# Patient Record
Sex: Male | Born: 1950 | Race: White | Hispanic: No | Marital: Married | Smoking: Never smoker
Health system: Southern US, Community
[De-identification: ages and names within clinical notes are randomized; demographics above are authoritative.]

## PROBLEM LIST (undated history)

## (undated) DIAGNOSIS — F419 Anxiety disorder, unspecified: Secondary | ICD-10-CM

## (undated) DIAGNOSIS — I471 Supraventricular tachycardia, unspecified: Secondary | ICD-10-CM

## (undated) DIAGNOSIS — K219 Gastro-esophageal reflux disease without esophagitis: Secondary | ICD-10-CM

## (undated) DIAGNOSIS — E119 Type 2 diabetes mellitus without complications: Secondary | ICD-10-CM

## (undated) DIAGNOSIS — R7303 Prediabetes: Secondary | ICD-10-CM

## (undated) DIAGNOSIS — F32A Depression, unspecified: Secondary | ICD-10-CM

## (undated) DIAGNOSIS — E785 Hyperlipidemia, unspecified: Secondary | ICD-10-CM

## (undated) DIAGNOSIS — F329 Major depressive disorder, single episode, unspecified: Secondary | ICD-10-CM

## (undated) DIAGNOSIS — I10 Essential (primary) hypertension: Secondary | ICD-10-CM

## (undated) DIAGNOSIS — T7840XA Allergy, unspecified, initial encounter: Secondary | ICD-10-CM

## (undated) DIAGNOSIS — H269 Unspecified cataract: Secondary | ICD-10-CM

## (undated) HISTORY — PX: APPENDECTOMY: SHX54

## (undated) HISTORY — PX: POLYPECTOMY: SHX149

## (undated) HISTORY — DX: Major depressive disorder, single episode, unspecified: F32.9

## (undated) HISTORY — DX: Allergy, unspecified, initial encounter: T78.40XA

## (undated) HISTORY — PX: VASECTOMY: SHX75

## (undated) HISTORY — DX: Depression, unspecified: F32.A

## (undated) HISTORY — DX: Unspecified cataract: H26.9

## (undated) HISTORY — PX: EYE SURGERY: SHX253

## (undated) HISTORY — DX: Anxiety disorder, unspecified: F41.9

## (undated) HISTORY — PX: COLONOSCOPY: SHX174

## (undated) HISTORY — DX: Hyperlipidemia, unspecified: E78.5

---

## 2002-08-07 ENCOUNTER — Ambulatory Visit (HOSPITAL_COMMUNITY): Admission: RE | Admit: 2002-08-07 | Discharge: 2002-08-07 | Payer: Self-pay | Admitting: Internal Medicine

## 2002-08-07 ENCOUNTER — Encounter: Payer: Self-pay | Admitting: Internal Medicine

## 2003-10-11 ENCOUNTER — Ambulatory Visit (HOSPITAL_COMMUNITY): Admission: RE | Admit: 2003-10-11 | Discharge: 2003-10-11 | Payer: Self-pay | Admitting: Internal Medicine

## 2004-12-22 ENCOUNTER — Ambulatory Visit: Payer: Self-pay | Admitting: Orthopedic Surgery

## 2007-06-16 ENCOUNTER — Ambulatory Visit: Payer: Self-pay | Admitting: Internal Medicine

## 2007-06-30 ENCOUNTER — Ambulatory Visit: Payer: Self-pay | Admitting: Internal Medicine

## 2008-05-03 ENCOUNTER — Encounter (HOSPITAL_COMMUNITY): Admission: RE | Admit: 2008-05-03 | Discharge: 2008-05-28 | Payer: Self-pay | Admitting: Internal Medicine

## 2008-07-06 ENCOUNTER — Emergency Department (HOSPITAL_COMMUNITY): Admission: EM | Admit: 2008-07-06 | Discharge: 2008-07-06 | Payer: Self-pay | Admitting: Emergency Medicine

## 2008-08-05 ENCOUNTER — Emergency Department (HOSPITAL_COMMUNITY): Admission: EM | Admit: 2008-08-05 | Discharge: 2008-08-05 | Payer: Self-pay | Admitting: Emergency Medicine

## 2011-01-13 NOTE — Procedures (Signed)
Edward Armstrong, Edward Armstrong                 ACCOUNT NO.:  [id number]   MEDICAL RECORD NO.:  [id number]         PATIENT TYPE:  PREC   LOCATION:  RAD                           FACILITY:  [facility]   PHYSICIAN:  Kingsley Callander. Ouida Sills, MD       DATE OF BIRTH:  [birth year]   DATE OF PROCEDURE:  05/03/2008  DATE OF DISCHARGE:                                  STRESS TEST   The patient underwent a Myoview stress test for evaluation of recent  symptoms of chest pain.  He exercised 10 minutes and 32 seconds (1  minute and 32 seconds in the stage IV of the Bruce protocol) attaining a  maximal heart rate of 178 (109% of the age-predicted maximal heart rate)  at a workload of 12.8 METS and discontinued exercise due to fatigue.  There were no symptoms of chest pain.  There were no arrhythmias.  A 1  mm inferolateral ST-segment depression was noted during the exercise.  His baseline EKG revealed normal sinus rhythm at 79 beats per minute.   IMPRESSION:  Abnormal exercise stress test with inferolateral ST-segment  changes during peak exercise.  Myoview image is pending.      Kingsley Callander. Ouida Sills, MD  Electronically Signed     ROF/MEDQ  D:  05/03/2008  T:  05/03/2008  Job:  [id number]

## 2011-01-16 NOTE — Procedures (Signed)
NAME:  Edward Armstrong, Edward Armstrong                           ACCOUNT NO.:  [id number]   MEDICAL RECORD NO.:  [id number]                   PATIENT TYPE:  OUT   LOCATION:  [facility]                                 FACILITY:  [facility]   PHYSICIAN:  Kingsley Callander. Ouida Sills, M.D.                  DATE OF BIRTH:  14-Feb-1951   DATE OF PROCEDURE:  10/11/2003  DATE OF DISCHARGE:                                    STRESS TEST   DESCRIPTION:  [name] exercised 10 minutes (1 minute in stage 4 of the Bruce  protocol) attaining a maximal heart rate of 189 (112% of the age-predicted  maximal heart rate) at a work load of 12.8 METs.  There were no symptoms of  angina.  There were no arrhythmias.  There were no ST segment changes  diagnostic of ischemia.  There was a T wave change noted in lead III only at  peak exercise.  The baseline EKG revealed normal sinus rhythm at 77 beats  per minute.   IMPRESSION:  No evidence of exercise-induced ischemia.      ___________________________________________                                            Kingsley Callander. Ouida Sills, M.D.   ROF/MEDQ  D:  10/11/2003  T:  10/11/2003  Job:  [id number]

## 2013-02-21 ENCOUNTER — Ambulatory Visit (INDEPENDENT_AMBULATORY_CARE_PROVIDER_SITE_OTHER): Payer: BC Managed Care – PPO

## 2013-02-21 ENCOUNTER — Ambulatory Visit (INDEPENDENT_AMBULATORY_CARE_PROVIDER_SITE_OTHER): Payer: BC Managed Care – PPO | Admitting: Orthopedic Surgery

## 2013-02-21 VITALS — BP 148/92 | Ht 71.0 in | Wt 231.0 lb

## 2013-02-21 DIAGNOSIS — M25519 Pain in unspecified shoulder: Secondary | ICD-10-CM

## 2013-02-21 DIAGNOSIS — M25512 Pain in left shoulder: Secondary | ICD-10-CM | POA: Insufficient documentation

## 2013-02-21 DIAGNOSIS — M75102 Unspecified rotator cuff tear or rupture of left shoulder, not specified as traumatic: Secondary | ICD-10-CM | POA: Insufficient documentation

## 2013-02-21 DIAGNOSIS — M67919 Unspecified disorder of synovium and tendon, unspecified shoulder: Secondary | ICD-10-CM

## 2013-02-21 NOTE — Patient Instructions (Addendum)
You have received a steroid shot. 15% of patients experience increased pain at the injection site with in the next 24 hours. This is best treated with ice and tylenol extra strength 2 tabs every 8 hours. If you are still having pain please call the office.   Apply MAXFREEZE  Twice a day (pick up over the counter)  Start exercises for shoulder

## 2013-02-22 ENCOUNTER — Encounter: Payer: Self-pay | Admitting: Orthopedic Surgery

## 2013-02-22 NOTE — Progress Notes (Signed)
Patient ID: Edward Twilley., male   DOB: 03-14-1951, 62 y.o.   MRN: [id number] Chief Complaint  Patient presents with  . Shoulder Pain    Left shoulder pain    62-year-old male with a history of left shoulder pain for the last 2 months. No trauma. Symptoms came on gradually. He has a history of hypertension and high cholesterol with a high hemoglobin A1c. He status post appendectomy and vasectomy. He is referred to [place] by Dr. Adriana Mccallum  Medications include Nexium 40, Zetia 10, enalapril 10, Lopressor 1.25, aspirin 81 mg, T12 thousand milligrams, multivitamin. Family history of heart disease and diabetes. Social history married, occupation Public house manager. Social habits no smoking social alcohol use caffeine daily no street drugs and a bachelor of science degree in business was obtained  History of snoring and heartburn joint pain anxiety and seasonal allergies under review of systems other systems not mentioned were reviewed and were negative  His shoulder pain is intermittent it occurs when he is reaching behind him moving his arm away from his body or holding something in his hand with his arm in full extension away from the body.  BP 148/92  Ht 5\' 11"  (1.803 m)  Wt 231 lb (104.781 kg)  BMI 32.23 kg/m2 General appearance is normal, the patient is alert and oriented x3 with normal mood and affect. Ambulation remains normal without assistive device or noticeable limping  He has full range of motion of his right shoulder with no tenderness pain or instability strength is normal scans intact good pulse and normal sensation  His left shoulder has tenderness in the rotator interval painful for elevation and internal rotation. Apprehension tests are normal strength tests show weakness in the supraspinatus at the ankle he can sign. Skin normal pulse intact sensation normal.  X-rays show no major abnormalities no fractures no bone abnormality  Impression rotator cuff syndrome recommend injection  exercises medication as needed and followup for reevaluation in several weeks  Subacromial Shoulder Injection Procedure Note  Pre-operative Diagnosis: left RC Syndrome  Post-operative Diagnosis: same  Indications: pain   Anesthesia: ethyl chloride   Procedure Details   Verbal consent was obtained for the procedure. The shoulder was prepped withalcohol and the skin was anesthetized. A 20 gauge needle was advanced into the subacromial space through posterior approach without difficulty  The space was then injected with 3 ml 1% lidocaine and 1 ml of depomedrol. The injection site was cleansed with isopropyl alcohol and a dressing was applied.  Complications:  None; patient tolerated the procedure well.

## 2014-04-17 ENCOUNTER — Other Ambulatory Visit (HOSPITAL_COMMUNITY): Payer: Self-pay | Admitting: Internal Medicine

## 2014-04-17 ENCOUNTER — Ambulatory Visit (HOSPITAL_COMMUNITY)
Admission: RE | Admit: 2014-04-17 | Discharge: 2014-04-17 | Disposition: A | Discharge status: Home / Self Care | Payer: BC Managed Care – PPO | Source: Ambulatory Visit | Attending: Internal Medicine | Admitting: Internal Medicine

## 2014-04-17 DIAGNOSIS — R059 Cough, unspecified: Secondary | ICD-10-CM | POA: Diagnosis not present

## 2014-04-17 DIAGNOSIS — R509 Fever, unspecified: Secondary | ICD-10-CM

## 2014-04-17 DIAGNOSIS — R05 Cough: Secondary | ICD-10-CM

## 2014-04-17 DIAGNOSIS — R0789 Other chest pain: Secondary | ICD-10-CM | POA: Insufficient documentation

## 2014-05-09 ENCOUNTER — Other Ambulatory Visit (HOSPITAL_COMMUNITY): Payer: Self-pay | Admitting: Internal Medicine

## 2014-05-09 ENCOUNTER — Ambulatory Visit (HOSPITAL_COMMUNITY)
Admission: RE | Admit: 2014-05-09 | Discharge: 2014-05-09 | Disposition: A | Discharge status: Home / Self Care | Payer: BC Managed Care – PPO | Source: Ambulatory Visit | Attending: Internal Medicine | Admitting: Internal Medicine

## 2014-05-09 DIAGNOSIS — J189 Pneumonia, unspecified organism: Secondary | ICD-10-CM

## 2015-10-05 ENCOUNTER — Emergency Department (INDEPENDENT_AMBULATORY_CARE_PROVIDER_SITE_OTHER)
Admission: EM | Admit: 2015-10-05 | Discharge: 2015-10-05 | Disposition: A | Discharge status: Home / Self Care | Payer: Managed Care, Other (non HMO) | Source: Home / Self Care | Attending: Family Medicine | Admitting: Family Medicine

## 2015-10-05 ENCOUNTER — Encounter (HOSPITAL_COMMUNITY): Payer: Self-pay | Admitting: Emergency Medicine

## 2015-10-05 DIAGNOSIS — H6121 Impacted cerumen, right ear: Secondary | ICD-10-CM | POA: Diagnosis not present

## 2015-10-05 HISTORY — DX: Essential (primary) hypertension: I10

## 2015-10-05 HISTORY — DX: Prediabetes: R73.03

## 2015-10-05 NOTE — ED Notes (Signed)
Here with right ear wax impaction that started 2 days ago Denies dizziness, slight pain after using otc ear drops

## 2015-10-05 NOTE — Discharge Instructions (Signed)
Cerumen Impaction The structures of the external ear canal secrete a waxy substance known as cerumen. Excess cerumen can build up in the ear canal, causing a condition known as cerumen impaction. Cerumen impaction can cause ear pain and disrupt the function of the ear. The rate of cerumen production differs for each individual. In certain individuals, the configuration of the ear canal may decrease his or her ability to naturally remove cerumen. CAUSES Cerumen impaction is caused by excessive cerumen production or buildup. RISK FACTORS  Frequent use of swabs to clean ears.  Having narrow ear canals.  Having eczema.  Being dehydrated. SIGNS AND SYMPTOMS  Diminished hearing.  Ear drainage.  Ear pain.  Ear itch. TREATMENT Treatment may involve:  Over-the-counter or prescription ear drops to soften the cerumen.  Removal of cerumen by a health care provider. This may be done with:  Irrigation with warm water. This is the most common method of removal.  Ear curettes and other instruments.  Surgery. This may be done in severe cases. HOME CARE INSTRUCTIONS  Take medicines only as directed by your health care provider.  Do not insert objects into the ear with the intent of cleaning the ear. PREVENTION  Do not insert objects into the ear, even with the intent of cleaning the ear. Removing cerumen as a part of normal hygiene is not necessary, and the use of swabs in the ear canal is not recommended.  Drink enough water to keep your urine clear or pale yellow.  Control your eczema if you have it. SEEK MEDICAL CARE IF:  You develop ear pain.  You develop bleeding from the ear.  The cerumen does not clear after you use ear drops as directed.   This information is not intended to replace advice given to you by your health care provider. Make sure you discuss any questions you have with your health care provider.   Document Released: 09/24/2004 Document Revised: 09/07/2014  Document Reviewed: 04/03/2015 Elsevier Interactive Patient Education 2016 [employer]  

## 2015-10-05 NOTE — ED Provider Notes (Signed)
CSN: [id number]     Arrival date & time 10/05/15  1429 History   First MD Initiated Contact with Patient 10/05/15 1548     Chief Complaint  Patient presents with  . Cerumen Impaction   (Consider location/radiation/quality/duration/timing/severity/associated sxs/prior Treatment) The history is provided by the patient. No language interpreter was used.  Right ear stopped up. He has been having reduced hearing for about 2-3 days. He tried OTC products with no improvement. He denies any pain, no discharge. Denies previous episode.  Past Medical History  Diagnosis Date  . Hypertension   . Pre-diabetes    Past Surgical History  Procedure Laterality Date  . Appendectomy     No family history on file. Social History  Substance Use Topics  . Smoking status: Never Smoker   . Smokeless tobacco: None  . Alcohol Use: Yes    Review of Systems  HENT: Negative for ear discharge and ear pain.        Right ear stopped up  Respiratory: Negative.   Cardiovascular: Negative.   All other systems reviewed and are negative.  Filed Vitals:   10/05/15 1517  BP: 157/94  Pulse: 102  Temp: 98.1 F (36.7 C)  TempSrc: Oral  SpO2: 96%    Allergies  Macrodantin and Septra  Home Medications   Prior to Admission medications   Medication Sig Start Date End Date Taking? Authorizing Provider  ALPRAZolam Duanne Moron) 0.5 MG tablet Take 0.5 mg by mouth at bedtime as needed for anxiety.   Yes Historical Provider, MD  esomeprazole (NEXIUM) 20 MG capsule Take 20 mg by mouth daily at 12 noon.   Yes Historical Provider, MD  esomeprazole (NEXIUM) 40 MG capsule Take 40 mg by mouth daily at 12 noon.   Yes Historical Provider, MD  ezetimibe (ZETIA) 10 MG tablet Take 10 mg by mouth daily.   Yes Historical Provider, MD   Meds Ordered and Administered this Visit  Medications - No data to display  BP 157/94 mmHg  Pulse 102  Temp(Src) 98.1 F (36.7 C) (Oral)  SpO2 96% No data found.   Physical Exam    Constitutional: He appears well-developed. No distress.  HENT:  Head: Normocephalic.  Left Ear: Tympanic membrane and ear canal normal.  Ears:  Cardiovascular: Normal rate, regular rhythm and normal heart sounds.   No murmur heard. Pulmonary/Chest: Effort normal and breath sounds normal. No respiratory distress. He has no wheezes.  Nursing note and vitals reviewed.   ED Course  Procedures (including critical care time)  Labs Review Labs Reviewed - No data to display  Imaging Review No results found.   Visual Acuity Review  Right Eye Distance:   Left Eye Distance:   Bilateral Distance:    Right Eye Near:   Left Eye Near:    Bilateral Near:         MDM  No diagnosis found. Cerumen impaction, right  Symptomatic cerumen impaction of the right ear. Patient consented to ear irrigation. Procedure completed by nursing without complication. Symptoms improved . Return precaution discussed. Follow up as needed.    Kinnie Feil, MD 10/05/15 [id number]

## 2015-10-28 ENCOUNTER — Encounter (HOSPITAL_COMMUNITY): Payer: Self-pay | Admitting: Emergency Medicine

## 2015-10-28 ENCOUNTER — Emergency Department (INDEPENDENT_AMBULATORY_CARE_PROVIDER_SITE_OTHER)
Admission: EM | Admit: 2015-10-28 | Discharge: 2015-10-28 | Disposition: A | Discharge status: Home / Self Care | Payer: Managed Care, Other (non HMO) | Source: Home / Self Care | Attending: Family Medicine | Admitting: Family Medicine

## 2015-10-28 DIAGNOSIS — J111 Influenza due to unidentified influenza virus with other respiratory manifestations: Secondary | ICD-10-CM

## 2015-10-28 HISTORY — DX: Gastro-esophageal reflux disease without esophagitis: K21.9

## 2015-10-28 HISTORY — DX: Type 2 diabetes mellitus without complications: E11.9

## 2015-10-28 MED ORDER — OSELTAMIVIR PHOSPHATE 75 MG PO CAPS: 75.0000 mg | ORAL_CAPSULE | Freq: Two times a day (BID) | ORAL | Status: DC

## 2015-10-28 NOTE — Discharge Instructions (Signed)

## 2015-10-28 NOTE — ED Provider Notes (Signed)
CSN: [id number]     Arrival date & time 10/28/15  1843 History   First MD Initiated Contact with Patient 10/28/15 1957     Chief Complaint  Patient presents with  . Cough  . Generalized Body Aches   (Consider location/radiation/quality/duration/timing/severity/associated sxs/prior Treatment) HPI Pt presents with sore throat, body aches, fever, chills for 1 days Home treatment has been OTC meds without much relief of symptoms Fever is improved for short periods of time with OTC antipyretics. Pain score is 4 mostly from coughing and body aches Taking fluids, no appetite No flu shot (son had bad reaction to vaccine.  Has been exposed to others with similar symptoms.  Denies: CP, SOB, vomiting or diarrhea.  Past Medical History  Diagnosis Date  . Hypertension   . Pre-diabetes   . Diabetes mellitus without complication ([place])   . GERD (gastroesophageal reflux disease)    Past Surgical History  Procedure Laterality Date  . Appendectomy     Family History  Problem Relation Age of Onset  . Diabetes Mother   . Heart attack Father    Social History  Substance Use Topics  . Smoking status: Never Smoker   . Smokeless tobacco: None  . Alcohol Use: Yes    Review of Systems See history of present illness Allergies  Macrodantin and Septra  Home Medications   Prior to Admission medications   Medication Sig Start Date End Date Taking? Authorizing Provider  ALPRAZolam Duanne Moron) 0.5 MG tablet Take 0.5 mg by mouth at bedtime as needed for anxiety.   Yes Historical Provider, MD  enalapril (VASOTEC) 20 MG tablet Take 20 mg by mouth daily.   Yes Historical Provider, MD  esomeprazole (NEXIUM) 40 MG capsule Take 40 mg by mouth daily at 12 noon.   Yes Historical Provider, MD  ezetimibe (ZETIA) 10 MG tablet Take 10 mg by mouth daily.   Yes Historical Provider, MD  metFORMIN (GLUCOPHAGE) 500 MG tablet Take by mouth daily.   Yes Historical Provider, MD  esomeprazole (NEXIUM) 20 MG capsule Take  20 mg by mouth daily at 12 noon.    Historical Provider, MD  oseltamivir (TAMIFLU) 75 MG capsule Take 1 capsule (75 mg total) by mouth every 12 (twelve) hours. 10/28/15   Konrad Felix, PA  oseltamivir (TAMIFLU) 75 MG capsule Take 1 capsule (75 mg total) by mouth every 12 (twelve) hours. 10/28/15   Konrad Felix, PA   Meds Ordered and Administered this Visit  Medications - No data to display  BP 144/76 mmHg  Pulse 92  Temp(Src) 99.1 F (37.3 C) (Oral)  Resp 18  SpO2 98% No data found.   Physical Exam NURSES NOTES AND VITAL SIGNS REVIEWED. CONSTITUTIONAL: Well developed, well nourished, no acute distress HEENT: normocephalic, atraumatic, right and left TM's are normal EYES: Conjunctiva normal NECK:normal ROM, supple, no adenopathy PULMONARY:No respiratory distress, normal effort, Lungs: CTAb/l, no wheezes, or increased work of breathing CARDIOVASCULAR: RRR, no murmur ABDOMEN: soft, ND, NT, +'ve BS MUSCULOSKELETAL: Normal ROM of all extremities,  SKIN: warm and dry without rash PSYCHIATRIC: Mood and affect, behavior are normal  ED Course  Procedures (including critical care time)  Labs Review Labs Reviewed - No data to display  Imaging Review No results found.   Visual Acuity Review  Right Eye Distance:   Left Eye Distance:   Bilateral Distance:    Right Eye Near:   Left Eye Near:    Bilateral Near:  MDM   1. Flu    patient is requesting Tamiflu. Symptomatic treatment at home including ibuprofen for body aches and fever. He is also advised to use lots of fluids.    Konrad Felix, PA 10/28/15 2047

## 2015-10-28 NOTE — ED Notes (Signed)
Pt started with a cough yesterday.  Today he started with body aches, a mild headache, and low grade temp of 99.5. Pt took Advil at 1730 today.

## 2016-07-07 ENCOUNTER — Encounter (INDEPENDENT_AMBULATORY_CARE_PROVIDER_SITE_OTHER): Payer: Self-pay | Admitting: Ophthalmology

## 2016-07-17 ENCOUNTER — Encounter (INDEPENDENT_AMBULATORY_CARE_PROVIDER_SITE_OTHER): Payer: Managed Care, Other (non HMO) | Admitting: Ophthalmology

## 2016-07-17 DIAGNOSIS — H2513 Age-related nuclear cataract, bilateral: Secondary | ICD-10-CM | POA: Diagnosis not present

## 2016-07-17 DIAGNOSIS — I10 Essential (primary) hypertension: Secondary | ICD-10-CM

## 2016-07-17 DIAGNOSIS — D3132 Benign neoplasm of left choroid: Secondary | ICD-10-CM

## 2016-07-17 DIAGNOSIS — H33301 Unspecified retinal break, right eye: Secondary | ICD-10-CM

## 2016-07-17 DIAGNOSIS — H43813 Vitreous degeneration, bilateral: Secondary | ICD-10-CM | POA: Diagnosis not present

## 2016-07-17 DIAGNOSIS — H35033 Hypertensive retinopathy, bilateral: Secondary | ICD-10-CM | POA: Diagnosis not present

## 2016-08-03 ENCOUNTER — Ambulatory Visit (INDEPENDENT_AMBULATORY_CARE_PROVIDER_SITE_OTHER): Payer: Managed Care, Other (non HMO) | Admitting: Ophthalmology

## 2016-08-03 DIAGNOSIS — H33301 Unspecified retinal break, right eye: Secondary | ICD-10-CM

## 2016-12-08 ENCOUNTER — Ambulatory Visit (INDEPENDENT_AMBULATORY_CARE_PROVIDER_SITE_OTHER): Payer: Managed Care, Other (non HMO) | Admitting: Ophthalmology

## 2016-12-18 ENCOUNTER — Ambulatory Visit (INDEPENDENT_AMBULATORY_CARE_PROVIDER_SITE_OTHER): Payer: Managed Care, Other (non HMO) | Admitting: Ophthalmology

## 2016-12-18 DIAGNOSIS — H2513 Age-related nuclear cataract, bilateral: Secondary | ICD-10-CM | POA: Diagnosis not present

## 2016-12-18 DIAGNOSIS — D3132 Benign neoplasm of left choroid: Secondary | ICD-10-CM

## 2016-12-18 DIAGNOSIS — H35033 Hypertensive retinopathy, bilateral: Secondary | ICD-10-CM | POA: Diagnosis not present

## 2016-12-18 DIAGNOSIS — I10 Essential (primary) hypertension: Secondary | ICD-10-CM | POA: Diagnosis not present

## 2016-12-18 DIAGNOSIS — H33301 Unspecified retinal break, right eye: Secondary | ICD-10-CM | POA: Diagnosis not present

## 2016-12-18 DIAGNOSIS — H43813 Vitreous degeneration, bilateral: Secondary | ICD-10-CM

## 2017-04-28 ENCOUNTER — Encounter: Payer: Self-pay | Admitting: *Deleted

## 2017-07-05 ENCOUNTER — Encounter: Payer: Self-pay | Admitting: Gastroenterology

## 2017-08-04 DIAGNOSIS — D229 Melanocytic nevi, unspecified: Secondary | ICD-10-CM | POA: Diagnosis not present

## 2017-08-27 ENCOUNTER — Encounter (HOSPITAL_COMMUNITY): Payer: Self-pay | Admitting: Emergency Medicine

## 2017-08-27 ENCOUNTER — Ambulatory Visit (HOSPITAL_COMMUNITY)
Admission: EM | Admit: 2017-08-27 | Discharge: 2017-08-27 | Disposition: A | Discharge status: Home / Self Care | Payer: Managed Care, Other (non HMO) | Attending: Internal Medicine | Admitting: Internal Medicine

## 2017-08-27 DIAGNOSIS — H9201 Otalgia, right ear: Secondary | ICD-10-CM

## 2017-08-27 MED ORDER — NEOMYCIN-POLYMYXIN-HC 3.5-10000-1 OT SUSP: 4.0000 [drp] | Freq: Four times a day (QID) | OTIC | 0 refills | Status: AC

## 2017-08-27 NOTE — ED Provider Notes (Signed)
[facility]    CSN: [id number] Arrival date & time: 08/27/17  1806     History   Chief Complaint Chief Complaint  Patient presents with  . Ear Fullness    HPI Edward Armstrong. is a 66 y.o. male.   66 year old male comes in for 1 day history of right ear pain.  States has decreased hearing and feeling of ear fullness for a while, he used his finger to scratch his ear today, and started having ear pain.  He states he has to wear ear plugs during work.  No frequent cotton swab use.  Denies  URI symptoms such as cough, congestion, sore throat.  Denies fever, chills, night sweats.  Has not tried anything for the symptoms.      Past Medical History:  Diagnosis Date  . Diabetes mellitus without complication ([place])   . GERD (gastroesophageal reflux disease)   . Hypertension   . Pre-diabetes     Patient Active Problem List   Diagnosis Date Noted  . Left shoulder pain 02/21/2013  . Rotator cuff syndrome of left shoulder 02/21/2013    Past Surgical History:  Procedure Laterality Date  . APPENDECTOMY         Home Medications    Prior to Admission medications   Medication Sig Start Date End Date Taking? Authorizing Provider  ALPRAZolam Duanne Moron) 0.5 MG tablet Take 0.5 mg by mouth at bedtime as needed for anxiety.   Yes [provider]  enalapril (VASOTEC) 20 MG tablet Take 20 mg by mouth daily.   Yes [provider]  esomeprazole (NEXIUM) 20 MG capsule Take 20 mg by mouth daily at 12 noon.   Yes [provider]  ezetimibe (ZETIA) 10 MG tablet Take 10 mg by mouth daily.   Yes [provider]  metFORMIN (GLUCOPHAGE) 500 MG tablet Take by mouth daily.   Yes [provider]  esomeprazole (NEXIUM) 40 MG capsule Take 40 mg by mouth daily at 12 noon.    [provider]  neomycin-polymyxin-hydrocortisone (CORTISPORIN) 3.5-10000-1 OTIC suspension Place 4 drops into the right ear 4 (four) times daily for 7 days.  08/27/17 09/03/17  Ok Edwards, PA-C    Family History Family History  Problem Relation Age of Onset  . Diabetes Mother   . Heart attack Father     Social History Social History   Tobacco Use  . Smoking status: Never Smoker  Substance Use Topics  . Alcohol use: Yes  . Drug use: No     Allergies   Macrodantin [nitrofurantoin macrocrystal]; Clindamycin/lincomycin; and Septra [sulfamethoxazole-trimethoprim]   Review of Systems Review of Systems  Reason unable to perform ROS: See HPI as above.     Physical Exam Triage Vital Signs ED Triage Vitals  Enc Vitals Group     BP 08/27/17 1827 (!) 185/72     Pulse Rate 08/27/17 1827 65     Resp 08/27/17 1827 16     Temp 08/27/17 1827 98.5 F (36.9 C)     Temp Source 08/27/17 1827 Oral     SpO2 08/27/17 1827 100 %     Weight 08/27/17 1826 200 lb (90.7 kg)     Height 08/27/17 1826 5\' 11"  (1.803 m)     Head Circumference --      Peak Flow --      Pain Score 08/27/17 1826 0     Pain Loc --      Pain Edu? --  Excl. in GC? --    No data found.  Updated Vital Signs BP (!) 185/72   Pulse 65   Temp 98.5 F (36.9 C) (Oral)   Resp 16   Ht 5\' 11"  (1.803 m)   Wt 200 lb (90.7 kg)   SpO2 100%   BMI 27.89 kg/m    Physical Exam  Constitutional: He is oriented to person, place, and time. He appears well-developed and well-nourished. No distress.  HENT:  Head: Normocephalic and atraumatic.  No tenderness on palpation of the tragus.  Cerumen impaction of the right ear.  TM not visible.  Post irrigation: Noticed film around the outer ear canal, removed with hemostat, foreign body consistent with plastic.  Ear canal with slight erythema.  TM pearly gray without erythema, bulging.  Eyes: Conjunctivae are normal. Pupils are equal, round, and reactive to light.  Neurological: He is alert and oriented to person, place, and time.     UC Treatments / Results  Labs (all labs ordered are listed, but only abnormal results are  displayed) Labs Reviewed - No data to display  EKG  EKG Interpretation None       Radiology No results found.  Procedures Procedures (including critical care time)  Medications Ordered in UC Medications - No data to display   Initial Impression / Assessment and Plan / UC Course  I have reviewed the triage vital signs and the nursing notes.  Pertinent labs & imaging results that were available during my care of the patient were reviewed by me and considered in my medical decision making (see chart for details).    Patient with improved symptoms after ear irrigation.  Discussed ear canal erythema could be due to irrigation, however, given patient's job requires repeat use of earplugs, will cover for otitis externa.  Cortisporin as directed.  Return precautions given.  Patient expresses understanding and agrees to plan.  Final Clinical Impressions(s) / UC Diagnoses   Final diagnoses:  Otalgia of right ear    ED Discharge Orders        Ordered    neomycin-polymyxin-hydrocortisone (CORTISPORIN) 3.5-10000-1 OTIC suspension  4 times daily     08/27/17 1853       Ok Edwards, PA-C 08/27/17 1900

## 2017-08-27 NOTE — ED Triage Notes (Signed)
PT reports right ear fullness that started today.

## 2017-08-27 NOTE — Discharge Instructions (Signed)
Right ear pain could be due to earwax buildup, irritation due to repetitive use of earplugs.  Right ear canal is slightly red, this could be due to the ear irrigation, or outer ear infection.  Start Cortisporin for possible outer ear infection.  He can try hydrogen peroxide periodically in the future to help soften earwax.  Follow-up with PCP for reevaluation if symptoms worsen/persist.

## 2017-12-01 ENCOUNTER — Encounter: Payer: Self-pay | Admitting: Gastroenterology

## 2018-01-21 ENCOUNTER — Ambulatory Visit (AMBULATORY_SURGERY_CENTER): Payer: Self-pay

## 2018-01-21 ENCOUNTER — Other Ambulatory Visit: Payer: Self-pay

## 2018-01-21 VITALS — Ht 71.0 in | Wt 209.4 lb

## 2018-01-21 DIAGNOSIS — Z1211 Encounter for screening for malignant neoplasm of colon: Secondary | ICD-10-CM

## 2018-01-21 MED ORDER — NA SULFATE-K SULFATE-MG SULF 17.5-3.13-1.6 GM/177ML PO SOLN: 1.0000 | Freq: Once | ORAL | 0 refills | Status: AC

## 2018-01-21 NOTE — Progress Notes (Signed)
Denies allergies to eggs or soy products. Denies complication of anesthesia or sedation. Denies use of weight loss medication. Denies use of O2.   Emmi instructions declined.  

## 2018-01-31 ENCOUNTER — Encounter: Payer: Self-pay | Admitting: Gastroenterology

## 2018-02-04 ENCOUNTER — Other Ambulatory Visit: Payer: Self-pay

## 2018-02-04 ENCOUNTER — Encounter: Payer: Self-pay | Admitting: Gastroenterology

## 2018-02-04 ENCOUNTER — Ambulatory Visit (AMBULATORY_SURGERY_CENTER): Payer: Managed Care, Other (non HMO) | Admitting: Gastroenterology

## 2018-02-04 VITALS — BP 124/74 | HR 60 | Temp 97.1°F | Resp 10 | Ht 71.0 in | Wt 209.0 lb

## 2018-02-04 DIAGNOSIS — D122 Benign neoplasm of ascending colon: Secondary | ICD-10-CM

## 2018-02-04 DIAGNOSIS — K635 Polyp of colon: Secondary | ICD-10-CM | POA: Diagnosis not present

## 2018-02-04 DIAGNOSIS — D123 Benign neoplasm of transverse colon: Secondary | ICD-10-CM

## 2018-02-04 DIAGNOSIS — D120 Benign neoplasm of cecum: Secondary | ICD-10-CM | POA: Diagnosis not present

## 2018-02-04 DIAGNOSIS — Z1211 Encounter for screening for malignant neoplasm of colon: Secondary | ICD-10-CM

## 2018-02-04 DIAGNOSIS — D125 Benign neoplasm of sigmoid colon: Secondary | ICD-10-CM

## 2018-02-04 MED ORDER — SODIUM CHLORIDE 0.9 % IV SOLN: 500.0000 mL | Freq: Once | INTRAVENOUS | Status: DC

## 2018-02-04 NOTE — Progress Notes (Signed)
A/ox3 pleased with MAC, report to RN 

## 2018-02-04 NOTE — Patient Instructions (Signed)
HANDOUTS GIVEN FOR POLYPS, DIVERTICULOSIS AND HEMORRHOIDS  YOU HAD AN ENDOSCOPIC PROCEDURE TODAY AT THE [facility]:   Refer to the procedure report that was given to you for any specific questions about what was found during the examination.  If the procedure report does not answer your questions, please call your gastroenterologist to clarify.  If you requested that your care partner not be given the details of your procedure findings, then the procedure report has been included in a sealed envelope for you to review at your convenience later.  YOU SHOULD EXPECT: Some feelings of bloating in the abdomen. Passage of more gas than usual.  Walking can help get rid of the air that was put into your GI tract during the procedure and reduce the bloating. If you had a lower endoscopy (such as a colonoscopy or flexible sigmoidoscopy) you may notice spotting of blood in your stool or on the toilet paper. If you underwent a bowel prep for your procedure, you may not have a normal bowel movement for a few days.  Please Note:  You might notice some irritation and congestion in your nose or some drainage.  This is from the oxygen used during your procedure.  There is no need for concern and it should clear up in a day or so.  SYMPTOMS TO REPORT IMMEDIATELY:   Following lower endoscopy (colonoscopy or flexible sigmoidoscopy):  Excessive amounts of blood in the stool  Significant tenderness or worsening of abdominal pains  Swelling of the abdomen that is new, acute  Fever of 100F or higher  For urgent or emergent issues, a gastroenterologist can be reached at any hour by calling [phone].   DIET:  We do recommend a small meal at first, but then you may proceed to your regular diet.  Drink plenty of fluids but you should avoid alcoholic beverages for 24 hours.  ACTIVITY:  You should plan to take it easy for the rest of today and you should NOT DRIVE or use heavy machinery until tomorrow  (because of the sedation medicines used during the test).    FOLLOW UP: Our staff will call the number listed on your records the next business day following your procedure to check on you and address any questions or concerns that you may have regarding the information given to you following your procedure. If we do not reach you, we will leave a message.  However, if you are feeling well and you are not experiencing any problems, there is no need to return our call.  We will assume that you have returned to your regular daily activities without incident.  If any biopsies were taken you will be contacted by phone or by letter within the next 1-3 weeks.  Please call us at [phone] if you have not heard about the biopsies in 3 weeks.    SIGNATURES/CONFIDENTIALITY: You and/or your care partner have signed paperwork which will be entered into your electronic medical record.  These signatures attest to the fact that that the information above on your After Visit Summary has been reviewed and is understood.  Full responsibility of the confidentiality of this discharge information lies with you and/or your care-partner. 

## 2018-02-04 NOTE — Progress Notes (Signed)
Pt's states no medical or surgical changes since previsit or office visit. 

## 2018-02-04 NOTE — Op Note (Signed)
[facility] Patient Name: Edward Armstrong Procedure Date: 02/04/2018 11:34 AM MRN: [id number] Endoscopist: Remo Lipps P. Havery Moros , MD Age: 67 Referring MD:  Date of Birth: [birth year] Gender: Male Account #: [id number] Procedure:                Colonoscopy Indications:              Screening for colorectal malignant neoplasm Medicines:                Monitored Anesthesia Care Procedure:                Pre-Anesthesia Assessment:                           - Prior to the procedure, a History and Physical                            was performed, and patient medications and                            allergies were reviewed. The patient's tolerance of                            previous anesthesia was also reviewed. The risks                            and benefits of the procedure and the sedation                            options and risks were discussed with the patient.                            All questions were answered, and informed consent                            was obtained. Prior Anticoagulants: The patient has                            taken no previous anticoagulant or antiplatelet                            agents. ASA Grade Assessment: II - A patient with                            mild systemic disease. After reviewing the risks                            and benefits, the patient was deemed in                            satisfactory condition to undergo the procedure.                           After obtaining informed consent, the colonoscope  was passed under direct vision. Throughout the                            procedure, the patient's blood pressure, pulse, and                            oxygen saturations were monitored continuously. The                            Colonoscope was introduced through the anus and                            advanced to the the cecum, identified by                            appendiceal orifice and  ileocecal valve. The                            colonoscopy was performed without difficulty. The                            patient tolerated the procedure well. The quality                            of the bowel preparation was good. The ileocecal                            valve, appendiceal orifice, and rectum were                            photographed. Scope In: 11:37:52 AM Scope Out: 58:52:77 PM Scope Withdrawal Time: 0 hours 23 minutes 52 seconds  Total Procedure Duration: 0 hours 28 minutes 24 seconds  Findings:                 The perianal and digital rectal examinations were                            normal.                           A 3 mm polyp was found in the ileocecal valve. The                            polyp was sessile. The polyp was removed with a                            cold snare. Resection and retrieval were complete.                           A 3 mm polyp was found in the ascending colon. The                            polyp was sessile. The polyp was removed with a  cold snare. Resection and retrieval were complete.                           Two sessile polyps were found in the transverse                            colon. The polyps were 4 to 5 mm in size. These                            polyps were removed with a cold snare. Resection                            and retrieval were complete.                           A 3 mm polyp was found in the sigmoid colon. The                            polyp was sessile. The polyp was removed with a                            cold snare. Resection and retrieval were complete.                           A few medium-mouthed diverticula were found in the                            sigmoid colon.                           Internal hemorrhoids were found during retroflexion.                           The exam was otherwise without abnormality. Complications:            No immediate complications.  Estimated blood loss:                            Minimal. Estimated Blood Loss:     Estimated blood loss was minimal. Impression:               - One 3 mm polyp at the ileocecal valve, removed                            with a cold snare. Resected and retrieved.                           - One 3 mm polyp in the ascending colon, removed                            with a cold snare. Resected and retrieved.                           - Two 4 to 5 mm polyps  in the transverse colon,                            removed with a cold snare. Resected and retrieved.                           - One 3 mm polyp in the sigmoid colon, removed with                            a cold snare. Resected and retrieved.                           - Diverticulosis in the sigmoid colon.                           - Internal hemorrhoids.                           - The examination was otherwise normal. Recommendation:           - Patient has a contact number available for                            emergencies. The signs and symptoms of potential                            delayed complications were discussed with the                            patient. Return to normal activities tomorrow.                            Written discharge instructions were provided to the                            patient.                           - Resume previous diet.                           - Continue present medications.                           - Await pathology results.                           - Repeat colonoscopy for surveillance based on                            pathology results. Remo Lipps P. Armbruster, MD 02/04/2018 12:10:31 PM This report has been signed electronically.

## 2018-02-04 NOTE — Progress Notes (Signed)
Called to room to assist during endoscopic procedure.  Patient ID and intended procedure confirmed with present staff. Received instructions for my participation in the procedure from the performing physician.  

## 2018-02-07 ENCOUNTER — Telehealth: Payer: Self-pay | Admitting: *Deleted

## 2018-02-07 NOTE — Telephone Encounter (Signed)
  Follow up Call-  Call back number 02/04/2018  Post procedure Call Back phone  # [phone]  Permission to leave phone message Yes  Some recent data might be hidden     Patient questions:  Do you have a fever, pain , or abdominal swelling? No. Pain Score  0 *  Have you tolerated food without any problems? Yes.    Have you been able to return to your normal activities? Yes.    Do you have any questions about your discharge instructions: Diet   No. Medications  No. Follow up visit  No.  Do you have questions or concerns about your Care? No.  Actions: * If pain score is 4 or above: No action needed, pain <4.

## 2018-02-09 ENCOUNTER — Encounter (INDEPENDENT_AMBULATORY_CARE_PROVIDER_SITE_OTHER): Payer: Managed Care, Other (non HMO) | Admitting: Ophthalmology

## 2018-02-09 DIAGNOSIS — I10 Essential (primary) hypertension: Secondary | ICD-10-CM

## 2018-02-09 DIAGNOSIS — H33301 Unspecified retinal break, right eye: Secondary | ICD-10-CM | POA: Diagnosis not present

## 2018-02-09 DIAGNOSIS — D3132 Benign neoplasm of left choroid: Secondary | ICD-10-CM | POA: Diagnosis not present

## 2018-02-09 DIAGNOSIS — H43812 Vitreous degeneration, left eye: Secondary | ICD-10-CM | POA: Diagnosis not present

## 2018-02-09 DIAGNOSIS — H35033 Hypertensive retinopathy, bilateral: Secondary | ICD-10-CM

## 2018-02-09 DIAGNOSIS — H4312 Vitreous hemorrhage, left eye: Secondary | ICD-10-CM

## 2018-02-09 DIAGNOSIS — D3131 Benign neoplasm of right choroid: Secondary | ICD-10-CM

## 2018-02-09 DIAGNOSIS — H2513 Age-related nuclear cataract, bilateral: Secondary | ICD-10-CM

## 2018-02-14 ENCOUNTER — Encounter: Payer: Self-pay | Admitting: Gastroenterology

## 2018-03-07 DIAGNOSIS — I10 Essential (primary) hypertension: Secondary | ICD-10-CM | POA: Diagnosis not present

## 2018-03-07 DIAGNOSIS — Z6829 Body mass index (BMI) 29.0-29.9, adult: Secondary | ICD-10-CM | POA: Diagnosis not present

## 2018-03-07 DIAGNOSIS — E119 Type 2 diabetes mellitus without complications: Secondary | ICD-10-CM | POA: Diagnosis not present

## 2018-03-16 ENCOUNTER — Encounter (INDEPENDENT_AMBULATORY_CARE_PROVIDER_SITE_OTHER): Payer: Managed Care, Other (non HMO) | Admitting: Ophthalmology

## 2018-03-21 ENCOUNTER — Encounter (INDEPENDENT_AMBULATORY_CARE_PROVIDER_SITE_OTHER): Payer: Medicare Other | Admitting: Ophthalmology

## 2018-03-21 DIAGNOSIS — H35033 Hypertensive retinopathy, bilateral: Secondary | ICD-10-CM

## 2018-03-21 DIAGNOSIS — D3132 Benign neoplasm of left choroid: Secondary | ICD-10-CM

## 2018-03-21 DIAGNOSIS — H2513 Age-related nuclear cataract, bilateral: Secondary | ICD-10-CM

## 2018-03-21 DIAGNOSIS — H33301 Unspecified retinal break, right eye: Secondary | ICD-10-CM | POA: Diagnosis not present

## 2018-03-21 DIAGNOSIS — D3131 Benign neoplasm of right choroid: Secondary | ICD-10-CM

## 2018-03-21 DIAGNOSIS — H43812 Vitreous degeneration, left eye: Secondary | ICD-10-CM

## 2018-03-21 DIAGNOSIS — I10 Essential (primary) hypertension: Secondary | ICD-10-CM | POA: Diagnosis not present

## 2018-05-16 ENCOUNTER — Encounter (INDEPENDENT_AMBULATORY_CARE_PROVIDER_SITE_OTHER): Payer: Medicare Other | Admitting: Ophthalmology

## 2018-05-16 DIAGNOSIS — D3131 Benign neoplasm of right choroid: Secondary | ICD-10-CM

## 2018-05-16 DIAGNOSIS — H33301 Unspecified retinal break, right eye: Secondary | ICD-10-CM | POA: Diagnosis not present

## 2018-05-16 DIAGNOSIS — I10 Essential (primary) hypertension: Secondary | ICD-10-CM

## 2018-05-16 DIAGNOSIS — H2513 Age-related nuclear cataract, bilateral: Secondary | ICD-10-CM | POA: Diagnosis not present

## 2018-05-16 DIAGNOSIS — H35033 Hypertensive retinopathy, bilateral: Secondary | ICD-10-CM | POA: Diagnosis not present

## 2018-05-16 DIAGNOSIS — D3132 Benign neoplasm of left choroid: Secondary | ICD-10-CM

## 2018-05-16 DIAGNOSIS — H43813 Vitreous degeneration, bilateral: Secondary | ICD-10-CM

## 2018-07-05 DIAGNOSIS — E119 Type 2 diabetes mellitus without complications: Secondary | ICD-10-CM | POA: Diagnosis not present

## 2018-07-11 DIAGNOSIS — Z23 Encounter for immunization: Secondary | ICD-10-CM | POA: Diagnosis not present

## 2018-07-11 DIAGNOSIS — E1159 Type 2 diabetes mellitus with other circulatory complications: Secondary | ICD-10-CM | POA: Diagnosis not present

## 2018-07-11 DIAGNOSIS — I10 Essential (primary) hypertension: Secondary | ICD-10-CM | POA: Diagnosis not present

## 2018-07-11 DIAGNOSIS — M25511 Pain in right shoulder: Secondary | ICD-10-CM | POA: Diagnosis not present

## 2018-07-11 DIAGNOSIS — Z6829 Body mass index (BMI) 29.0-29.9, adult: Secondary | ICD-10-CM | POA: Diagnosis not present

## 2018-07-18 DIAGNOSIS — M7541 Impingement syndrome of right shoulder: Secondary | ICD-10-CM | POA: Diagnosis not present

## 2018-07-18 DIAGNOSIS — M25511 Pain in right shoulder: Secondary | ICD-10-CM | POA: Diagnosis not present

## 2018-11-14 ENCOUNTER — Other Ambulatory Visit: Payer: Self-pay

## 2018-11-14 ENCOUNTER — Encounter (INDEPENDENT_AMBULATORY_CARE_PROVIDER_SITE_OTHER): Payer: Medicare Other | Admitting: Ophthalmology

## 2018-11-14 DIAGNOSIS — I10 Essential (primary) hypertension: Secondary | ICD-10-CM | POA: Diagnosis not present

## 2018-11-14 DIAGNOSIS — H35033 Hypertensive retinopathy, bilateral: Secondary | ICD-10-CM | POA: Diagnosis not present

## 2018-11-14 DIAGNOSIS — H43813 Vitreous degeneration, bilateral: Secondary | ICD-10-CM | POA: Diagnosis not present

## 2018-11-14 DIAGNOSIS — E11319 Type 2 diabetes mellitus with unspecified diabetic retinopathy without macular edema: Secondary | ICD-10-CM | POA: Diagnosis not present

## 2018-11-14 DIAGNOSIS — E113292 Type 2 diabetes mellitus with mild nonproliferative diabetic retinopathy without macular edema, left eye: Secondary | ICD-10-CM | POA: Diagnosis not present

## 2018-11-14 DIAGNOSIS — D3132 Benign neoplasm of left choroid: Secondary | ICD-10-CM | POA: Diagnosis not present

## 2019-01-03 DIAGNOSIS — Z125 Encounter for screening for malignant neoplasm of prostate: Secondary | ICD-10-CM | POA: Diagnosis not present

## 2019-01-03 DIAGNOSIS — I10 Essential (primary) hypertension: Secondary | ICD-10-CM | POA: Diagnosis not present

## 2019-01-03 DIAGNOSIS — K219 Gastro-esophageal reflux disease without esophagitis: Secondary | ICD-10-CM | POA: Diagnosis not present

## 2019-01-03 DIAGNOSIS — F419 Anxiety disorder, unspecified: Secondary | ICD-10-CM | POA: Diagnosis not present

## 2019-01-03 DIAGNOSIS — Z79899 Other long term (current) drug therapy: Secondary | ICD-10-CM | POA: Diagnosis not present

## 2019-01-03 DIAGNOSIS — E1159 Type 2 diabetes mellitus with other circulatory complications: Secondary | ICD-10-CM | POA: Diagnosis not present

## 2019-01-13 DIAGNOSIS — K219 Gastro-esophageal reflux disease without esophagitis: Secondary | ICD-10-CM | POA: Diagnosis not present

## 2019-01-13 DIAGNOSIS — E785 Hyperlipidemia, unspecified: Secondary | ICD-10-CM | POA: Diagnosis not present

## 2019-01-13 DIAGNOSIS — I10 Essential (primary) hypertension: Secondary | ICD-10-CM | POA: Diagnosis not present

## 2019-01-13 DIAGNOSIS — E1159 Type 2 diabetes mellitus with other circulatory complications: Secondary | ICD-10-CM | POA: Diagnosis not present

## 2019-01-13 DIAGNOSIS — M791 Myalgia, unspecified site: Secondary | ICD-10-CM | POA: Diagnosis not present

## 2019-06-02 DIAGNOSIS — Z23 Encounter for immunization: Secondary | ICD-10-CM | POA: Diagnosis not present

## 2019-07-03 DIAGNOSIS — E119 Type 2 diabetes mellitus without complications: Secondary | ICD-10-CM | POA: Diagnosis not present

## 2019-07-10 DIAGNOSIS — E1159 Type 2 diabetes mellitus with other circulatory complications: Secondary | ICD-10-CM | POA: Diagnosis not present

## 2019-07-17 DIAGNOSIS — E1159 Type 2 diabetes mellitus with other circulatory complications: Secondary | ICD-10-CM | POA: Diagnosis not present

## 2019-07-17 DIAGNOSIS — I10 Essential (primary) hypertension: Secondary | ICD-10-CM | POA: Diagnosis not present

## 2019-08-10 DIAGNOSIS — Z23 Encounter for immunization: Secondary | ICD-10-CM | POA: Diagnosis not present

## 2019-08-20 DIAGNOSIS — B349 Viral infection, unspecified: Secondary | ICD-10-CM | POA: Diagnosis not present

## 2019-08-20 DIAGNOSIS — Z20828 Contact with and (suspected) exposure to other viral communicable diseases: Secondary | ICD-10-CM | POA: Diagnosis not present

## 2019-09-21 DIAGNOSIS — Z23 Encounter for immunization: Secondary | ICD-10-CM | POA: Diagnosis not present

## 2019-10-27 DIAGNOSIS — Z23 Encounter for immunization: Secondary | ICD-10-CM | POA: Diagnosis not present

## 2019-11-13 DIAGNOSIS — M65332 Trigger finger, left middle finger: Secondary | ICD-10-CM | POA: Diagnosis not present

## 2019-11-13 DIAGNOSIS — G5603 Carpal tunnel syndrome, bilateral upper limbs: Secondary | ICD-10-CM | POA: Diagnosis not present

## 2019-11-13 DIAGNOSIS — M79642 Pain in left hand: Secondary | ICD-10-CM | POA: Diagnosis not present

## 2019-11-13 DIAGNOSIS — M720 Palmar fascial fibromatosis [Dupuytren]: Secondary | ICD-10-CM | POA: Diagnosis not present

## 2019-11-13 DIAGNOSIS — M79641 Pain in right hand: Secondary | ICD-10-CM | POA: Diagnosis not present

## 2019-11-17 ENCOUNTER — Encounter (INDEPENDENT_AMBULATORY_CARE_PROVIDER_SITE_OTHER): Payer: Medicare Other | Admitting: Ophthalmology

## 2019-11-17 DIAGNOSIS — D3132 Benign neoplasm of left choroid: Secondary | ICD-10-CM

## 2019-11-17 DIAGNOSIS — H33301 Unspecified retinal break, right eye: Secondary | ICD-10-CM

## 2019-11-17 DIAGNOSIS — H43813 Vitreous degeneration, bilateral: Secondary | ICD-10-CM | POA: Diagnosis not present

## 2019-11-17 DIAGNOSIS — D3131 Benign neoplasm of right choroid: Secondary | ICD-10-CM

## 2019-11-17 DIAGNOSIS — H35033 Hypertensive retinopathy, bilateral: Secondary | ICD-10-CM | POA: Diagnosis not present

## 2019-11-17 DIAGNOSIS — I10 Essential (primary) hypertension: Secondary | ICD-10-CM | POA: Diagnosis not present

## 2019-12-11 DIAGNOSIS — M65332 Trigger finger, left middle finger: Secondary | ICD-10-CM | POA: Diagnosis not present

## 2019-12-11 DIAGNOSIS — M720 Palmar fascial fibromatosis [Dupuytren]: Secondary | ICD-10-CM | POA: Diagnosis not present

## 2020-01-08 DIAGNOSIS — Z79899 Other long term (current) drug therapy: Secondary | ICD-10-CM | POA: Diagnosis not present

## 2020-01-08 DIAGNOSIS — Z125 Encounter for screening for malignant neoplasm of prostate: Secondary | ICD-10-CM | POA: Diagnosis not present

## 2020-01-08 DIAGNOSIS — E785 Hyperlipidemia, unspecified: Secondary | ICD-10-CM | POA: Diagnosis not present

## 2020-01-08 DIAGNOSIS — E1159 Type 2 diabetes mellitus with other circulatory complications: Secondary | ICD-10-CM | POA: Diagnosis not present

## 2020-01-08 DIAGNOSIS — F419 Anxiety disorder, unspecified: Secondary | ICD-10-CM | POA: Diagnosis not present

## 2020-01-08 DIAGNOSIS — I10 Essential (primary) hypertension: Secondary | ICD-10-CM | POA: Diagnosis not present

## 2020-01-08 DIAGNOSIS — K219 Gastro-esophageal reflux disease without esophagitis: Secondary | ICD-10-CM | POA: Diagnosis not present

## 2020-01-15 DIAGNOSIS — Z6828 Body mass index (BMI) 28.0-28.9, adult: Secondary | ICD-10-CM | POA: Diagnosis not present

## 2020-01-15 DIAGNOSIS — E1159 Type 2 diabetes mellitus with other circulatory complications: Secondary | ICD-10-CM | POA: Diagnosis not present

## 2020-01-15 DIAGNOSIS — M791 Myalgia, unspecified site: Secondary | ICD-10-CM | POA: Diagnosis not present

## 2020-01-15 DIAGNOSIS — E785 Hyperlipidemia, unspecified: Secondary | ICD-10-CM | POA: Diagnosis not present

## 2020-01-15 DIAGNOSIS — I10 Essential (primary) hypertension: Secondary | ICD-10-CM | POA: Diagnosis not present

## 2020-05-14 ENCOUNTER — Ambulatory Visit: Payer: Medicare Other | Admitting: Dermatology

## 2020-05-31 ENCOUNTER — Emergency Department (HOSPITAL_COMMUNITY)
Admission: EM | Admit: 2020-05-31 | Discharge: 2020-05-31 | Disposition: A | Discharge status: Home / Self Care | Payer: Medicare Other | Attending: Emergency Medicine | Admitting: Emergency Medicine

## 2020-05-31 ENCOUNTER — Ambulatory Visit
Admission: EM | Admit: 2020-05-31 | Discharge: 2020-05-31 | Disposition: A | Discharge status: Home / Self Care | Payer: Medicare Other

## 2020-05-31 ENCOUNTER — Emergency Department (HOSPITAL_COMMUNITY): Payer: Medicare Other

## 2020-05-31 ENCOUNTER — Encounter (HOSPITAL_COMMUNITY): Payer: Self-pay

## 2020-05-31 ENCOUNTER — Other Ambulatory Visit: Payer: Self-pay

## 2020-05-31 DIAGNOSIS — Z5321 Procedure and treatment not carried out due to patient leaving prior to being seen by health care provider: Secondary | ICD-10-CM | POA: Diagnosis not present

## 2020-05-31 DIAGNOSIS — R002 Palpitations: Secondary | ICD-10-CM | POA: Diagnosis not present

## 2020-05-31 LAB — CBG MONITORING, ED: Glucose-Capillary: 125 mg/dL — ABNORMAL HIGH (ref 70–99)

## 2020-05-31 NOTE — Discharge Instructions (Signed)
Unable to rule out cardiac disease in urgent care setting.  Offered patient further evaluation and management in the ED.  Patient declines at this time and would like to try outpatient therapy first.  Aware of the risk associated with this decision including missed diagnosis, organ damage, organ failure, and/or death.  Patient aware and in agreement.   EKG and physical exam reassuring, especially considering symptoms have resolved Rest and push fluids Avoid strenuous activities in the meantime Follow up with Dr. Willey Blade first thing Monday If you have palpitations, chest pain, shortness of breath, or stroke like symptoms please call 911 or go to the nearest hospital

## 2020-05-31 NOTE — ED Provider Notes (Signed)
[facility]   [id number] 05/31/20 Arrival Time: 77   CC: Heart flutter  SUBJECTIVE:  Edward Armstrong. is a 69 y.o. male who presents with complaint of heart flutter/ palpitations, now resolved, that occurred earlier today.  Lasted approximately 20 minuts.  Symptoms began while painting.  Localized symptoms to bilateral chest.  Denies alleviating factors, resolved without intervention.  Denies aggravating factors.  Denies radiating symptoms.  Reports previous symptoms in the past a couple of weeks ago.  Complains of associated dizziness (lasted a few seconds), now resolved, and anxiety.  Denies fever, chills, lightheadedness, tachycardia, SOB, nausea, vomiting, abdominal pain, changes in bowel or bladder habits, diaphoresis, numbness/tingling in extremities, peripheral edema.    Denies SOB, calf pain or swelling, recent long travel, recent surgery, malignancy, tobacco use, hormone use, or previous blood clot  Father died from MI when he was 41 years old.    ROS: As per HPI.  All other pertinent ROS negative.    Past Medical History:  Diagnosis Date  . Allergy   . Anxiety   . Depression   . Diabetes mellitus without complication ([place])   . GERD (gastroesophageal reflux disease)   . Hyperlipidemia   . Hypertension   . Pre-diabetes    Past Surgical History:  Procedure Laterality Date  . APPENDECTOMY    . VASECTOMY     Allergies  Allergen Reactions  . Macrodantin [Nitrofurantoin Macrocrystal] Hives  . Clindamycin/Lincomycin Hives  . Septra [Sulfamethoxazole-Trimethoprim] Hives   Current Facility-Administered Medications on File Prior to Encounter  Medication Dose Route Frequency Provider Last Rate Last Admin  . 0.9 %  sodium chloride infusion  500 mL Intravenous Once Armbruster, Carlota Raspberry, MD       Current Outpatient Medications on File Prior to Encounter  Medication Sig Dispense Refill  . ALPRAZolam (XANAX) 0.5 MG tablet Take 0.5 mg by mouth at bedtime as needed  for anxiety.    Marland Kitchen aspirin EC 81 MG tablet Take 81 mg by mouth daily.    . cycloSPORINE (RESTASIS) 0.05 % ophthalmic emulsion Place 1 drop into both eyes 2 (two) times daily.    . enalapril (VASOTEC) 20 MG tablet Take 20 mg by mouth daily.    Marland Kitchen esomeprazole (NEXIUM) 40 MG capsule Take 40 mg by mouth daily at 12 noon.    . ezetimibe (ZETIA) 10 MG tablet Take 10 mg by mouth daily.    . metFORMIN (GLUCOPHAGE) 500 MG tablet Take by mouth daily.    . Multiple Vitamin (MULTIVITAMIN) tablet Take 1 tablet by mouth daily.     Social History   Socioeconomic History  . Marital status: Married    Spouse name: Not on file  . Number of children: Not on file  . Years of education: Not on file  . Highest education level: Not on file  Occupational History  . Not on file  Tobacco Use  . Smoking status: Never Smoker  . Smokeless tobacco: Never Used  Vaping Use  . Vaping Use: Never used  Substance and Sexual Activity  . Alcohol use: Yes    Comment: Moderately  . Drug use: No  . Sexual activity: Not on file  Other Topics Concern  . Not on file  Social History Narrative  . Not on file   Social Determinants of Health   Financial Resource Strain:   . Difficulty of Paying Living Expenses: Not on file  Food Insecurity:   . Worried About Charity fundraiser in the  Last Year: Not on file  . Ran Out of Food in the Last Year: Not on file  Transportation Needs:   . Lack of Transportation (Medical): Not on file  . Lack of Transportation (Non-Medical): Not on file  Physical Activity:   . Days of Exercise per Week: Not on file  . Minutes of Exercise per Session: Not on file  Stress:   . Feeling of Stress : Not on file  Social Connections:   . Frequency of Communication with Friends and Family: Not on file  . Frequency of Social Gatherings with Friends and Family: Not on file  . Attends Religious Services: Not on file  . Active Member of Clubs or Organizations: Not on file  . Attends Theatre manager Meetings: Not on file  . Marital Status: Not on file  Intimate Partner Violence:   . Fear of Current or Ex-Partner: Not on file  . Emotionally Abused: Not on file  . Physically Abused: Not on file  . Sexually Abused: Not on file   Family History  Problem Relation Age of Onset  . Diabetes Mother   . Heart attack Father   . Colon cancer Neg Hx   . Esophageal cancer Neg Hx   . Liver cancer Neg Hx   . Pancreatic cancer Neg Hx   . Rectal cancer Neg Hx   . Stomach cancer Neg Hx      OBJECTIVE:  Vitals:   05/31/20 1812  BP: (!) 151/84  Pulse: 77  Resp: 20  Temp: 97.8 F (36.6 C)  SpO2: 98%    General appearance: alert; no distress Eyes: PERRLA; EOMI; conjunctiva normal HENT: normocephalic; atraumatic Neck: supple Lungs: clear to auscultation bilaterally without adventitious breath sounds Heart: regular rate and rhythm.  Clear S1 and S2 without rubs, gallops, or murmur. Abdomen: soft, non-tender; bowel sounds normal; no guarding or rebound tenderness Extremities: no cyanosis or edema; symmetrical with no gross deformities Skin: warm and dry Psychological: alert and cooperative; normal mood and affect  ECG: Orders placed or performed during the hospital encounter of 05/31/20  . EKG 12-Lead  . EKG 12-Lead   EKG normal sinus rhythm without ST elevations, depressions, or prolonged PR interval.  No narrowing or widening of the QRS complexes.    LABS:  Results for orders placed or performed during the hospital encounter of 05/31/20  CBG monitoring, ED  Result Value Ref Range   Glucose-Capillary 125 (H) 70 - 99 mg/dL   Labs Reviewed - No data to display   ASSESSMENT & PLAN:  1. Palpitations    Unable to rule out cardiac disease in urgent care setting.  Offered patient further evaluation and management in the ED.  Patient declines at this time and would like to try outpatient therapy first.  Aware of the risk associated with this decision including missed  diagnosis, organ damage, organ failure, and/or death.  Patient aware and in agreement.   EKG and physical exam reassuring, especially considering symptoms have resolved Rest and push fluids Avoid strenuous activities in the meantime Follow up with Dr. Willey Blade first thing Monday If you have palpitations, chest pain, shortness of breath, or stroke like symptoms please call 911 or go to the nearest hospital     Chest pain precautions given. Reviewed expectations re: course of current medical issues. Questions answered. Outlined signs and symptoms indicating need for more acute intervention. Patient verbalized understanding. After Visit Summary given.   Lestine Box, PA-C 05/31/20 1900

## 2020-05-31 NOTE — ED Triage Notes (Signed)
Pt began having heart palpitations that started 20 mins ago. He is denying sob. Pt has no cardiac history

## 2020-05-31 NOTE — ED Triage Notes (Signed)
Pt was seen in ED earlier for report of palpitations and feeling lightheaded, pt stated symptoms resolved so he left, and then happened again

## 2020-06-03 DIAGNOSIS — I491 Atrial premature depolarization: Secondary | ICD-10-CM | POA: Diagnosis not present

## 2020-06-03 DIAGNOSIS — R002 Palpitations: Secondary | ICD-10-CM | POA: Diagnosis not present

## 2020-06-04 ENCOUNTER — Telehealth: Payer: Self-pay

## 2020-06-04 NOTE — Telephone Encounter (Signed)
Received fax from Dr.Fagan's ofice for 72 hr holter monitor for palpitations, PAX   Enrolled in Zio XT to be mailed to patient home.

## 2020-06-06 ENCOUNTER — Ambulatory Visit: Payer: Medicare Other

## 2020-06-06 DIAGNOSIS — R002 Palpitations: Secondary | ICD-10-CM

## 2020-06-10 ENCOUNTER — Observation Stay (HOSPITAL_COMMUNITY)
Admission: EM | Admit: 2020-06-10 | Discharge: 2020-06-11 | Disposition: A | Discharge status: Home / Self Care | Payer: Medicare Other | Attending: Family Medicine | Admitting: Family Medicine

## 2020-06-10 ENCOUNTER — Emergency Department (HOSPITAL_COMMUNITY): Payer: Medicare Other

## 2020-06-10 ENCOUNTER — Encounter (HOSPITAL_COMMUNITY): Payer: Self-pay | Admitting: Nephrology

## 2020-06-10 ENCOUNTER — Other Ambulatory Visit: Payer: Self-pay

## 2020-06-10 DIAGNOSIS — I10 Essential (primary) hypertension: Secondary | ICD-10-CM | POA: Insufficient documentation

## 2020-06-10 DIAGNOSIS — R079 Chest pain, unspecified: Secondary | ICD-10-CM | POA: Diagnosis not present

## 2020-06-10 DIAGNOSIS — E119 Type 2 diabetes mellitus without complications: Secondary | ICD-10-CM | POA: Diagnosis not present

## 2020-06-10 DIAGNOSIS — R0789 Other chest pain: Secondary | ICD-10-CM | POA: Diagnosis present

## 2020-06-10 DIAGNOSIS — E785 Hyperlipidemia, unspecified: Secondary | ICD-10-CM | POA: Insufficient documentation

## 2020-06-10 DIAGNOSIS — I471 Supraventricular tachycardia, unspecified: Secondary | ICD-10-CM | POA: Diagnosis present

## 2020-06-10 DIAGNOSIS — R002 Palpitations: Secondary | ICD-10-CM | POA: Diagnosis not present

## 2020-06-10 DIAGNOSIS — Z79899 Other long term (current) drug therapy: Secondary | ICD-10-CM | POA: Insufficient documentation

## 2020-06-10 DIAGNOSIS — Z7982 Long term (current) use of aspirin: Secondary | ICD-10-CM | POA: Insufficient documentation

## 2020-06-10 DIAGNOSIS — Z20822 Contact with and (suspected) exposure to covid-19: Secondary | ICD-10-CM | POA: Diagnosis not present

## 2020-06-10 DIAGNOSIS — Z7984 Long term (current) use of oral hypoglycemic drugs: Secondary | ICD-10-CM | POA: Diagnosis not present

## 2020-06-10 LAB — BASIC METABOLIC PANEL
Anion gap: 11 (ref 5–15)
BUN: 15 mg/dL (ref 8–23)
CO2: 26 mmol/L (ref 22–32)
Calcium: 9.6 mg/dL (ref 8.9–10.3)
Chloride: 103 mmol/L (ref 98–111)
Creatinine, Ser: 1.01 mg/dL (ref 0.61–1.24)
GFR, Estimated: >60 mL/min (ref >60)
Glucose, Bld: 111 mg/dL — ABNORMAL HIGH (ref 70–99)
Potassium: 4.1 mmol/L (ref 3.5–5.1)
Sodium: 140 mmol/L (ref 135–145)

## 2020-06-10 LAB — CBC
HCT: 47.6 % (ref 39.0–52.0)
Hemoglobin: 16 g/dL (ref 13.0–17.0)
MCH: 31 pg (ref 26.0–34.0)
MCHC: 33.6 g/dL (ref 30.0–36.0)
MCV: 92.2 fL (ref 80.0–100.0)
Platelets: 292 K/uL (ref 150–400)
RBC: 5.16 MIL/uL (ref 4.22–5.81)
RDW: 12.2 % (ref 11.5–15.5)
WBC: 6.9 K/uL (ref 4.0–10.5)
nRBC: 0 % (ref 0.0–0.2)

## 2020-06-10 LAB — MAGNESIUM: Magnesium: 1.9 mg/dL (ref 1.7–2.4)

## 2020-06-10 LAB — TSH: TSH: 1.701 u[IU]/mL (ref 0.350–4.500)

## 2020-06-10 LAB — TROPONIN I (HIGH SENSITIVITY)
Troponin I (High Sensitivity): 2 ng/L (ref <18)
Troponin I (High Sensitivity): 3 ng/L (ref <18)

## 2020-06-10 MED ORDER — DILTIAZEM LOAD VIA INFUSION
10.0000 mg | Freq: Once | INTRAVENOUS | Status: AC
  Administered 2020-06-10: 10 mg via INTRAVENOUS
  Filled 2020-06-10: qty 10

## 2020-06-10 MED ORDER — ACETAMINOPHEN 650 MG RE SUPP: 650.0000 mg | Freq: Four times a day (QID) | RECTAL | Status: DC | PRN

## 2020-06-10 MED ORDER — ALPRAZOLAM 0.5 MG PO TABS: 0.2500 mg | ORAL_TABLET | Freq: Two times a day (BID) | ORAL | Status: DC | PRN

## 2020-06-10 MED ORDER — ONDANSETRON HCL 4 MG/2ML IJ SOLN: 4.0000 mg | Freq: Four times a day (QID) | INTRAMUSCULAR | Status: DC | PRN

## 2020-06-10 MED ORDER — SODIUM CHLORIDE 0.9 % IV SOLN: 250.0000 mL | INTRAVENOUS | Status: DC | PRN

## 2020-06-10 MED ORDER — DILTIAZEM HCL-DEXTROSE 125-5 MG/125ML-% IV SOLN (PREMIX)
5.0000 mg/h | INTRAVENOUS | Status: DC
  Administered 2020-06-10: 5 mg/h via INTRAVENOUS
  Filled 2020-06-10: qty 125

## 2020-06-10 MED ORDER — METOPROLOL TARTRATE 5 MG/5ML IV SOLN
5.0000 mg | Freq: Once | INTRAVENOUS | Status: AC
  Administered 2020-06-10: 5 mg via INTRAVENOUS
  Filled 2020-06-10: qty 5

## 2020-06-10 MED ORDER — ZOLPIDEM TARTRATE 5 MG PO TABS: 5.0000 mg | ORAL_TABLET | Freq: Every evening | ORAL | Status: DC | PRN

## 2020-06-10 MED ORDER — ENOXAPARIN SODIUM 40 MG/0.4ML ~~LOC~~ SOLN
40.0000 mg | SUBCUTANEOUS | Status: DC
  Administered 2020-06-11: 40 mg via SUBCUTANEOUS
  Filled 2020-06-10: qty 0.4

## 2020-06-10 MED ORDER — EZETIMIBE 10 MG PO TABS
10.0000 mg | ORAL_TABLET | Freq: Every day | ORAL | Status: DC
  Filled 2020-06-10 (×2): qty 1

## 2020-06-10 MED ORDER — ADULT MULTIVITAMIN W/MINERALS CH
1.0000 | ORAL_TABLET | Freq: Every day | ORAL | Status: DC
  Administered 2020-06-11: 1 via ORAL
  Filled 2020-06-10: qty 1

## 2020-06-10 MED ORDER — DILTIAZEM HCL-DEXTROSE 125-5 MG/125ML-% IV SOLN (PREMIX): 5.0000 mg/h | INTRAVENOUS | Status: DC

## 2020-06-10 MED ORDER — SODIUM CHLORIDE 0.9% FLUSH: 3.0000 mL | INTRAVENOUS | Status: DC | PRN

## 2020-06-10 MED ORDER — ASPIRIN EC 81 MG PO TBEC
81.0000 mg | DELAYED_RELEASE_TABLET | Freq: Every day | ORAL | Status: DC
  Administered 2020-06-11: 81 mg via ORAL
  Filled 2020-06-10: qty 1

## 2020-06-10 MED ORDER — ACETAMINOPHEN 325 MG PO TABS: 650.0000 mg | ORAL_TABLET | Freq: Four times a day (QID) | ORAL | Status: DC | PRN

## 2020-06-10 MED ORDER — ONDANSETRON HCL 4 MG PO TABS: 4.0000 mg | ORAL_TABLET | Freq: Four times a day (QID) | ORAL | Status: DC | PRN

## 2020-06-10 MED ORDER — SODIUM CHLORIDE 0.9% FLUSH: 3.0000 mL | Freq: Two times a day (BID) | INTRAVENOUS | Status: DC

## 2020-06-10 MED ORDER — PANTOPRAZOLE SODIUM 40 MG PO TBEC
40.0000 mg | DELAYED_RELEASE_TABLET | Freq: Every day | ORAL | Status: DC
  Administered 2020-06-11: 40 mg via ORAL
  Filled 2020-06-10: qty 1

## 2020-06-10 NOTE — Consult Note (Signed)
Cardiology Consultation:   Patient ID: Jovita Gamma. MRN: [id number]; DOB: [birth year]  Admit date: 06/10/2020 Date of Consult: 06/10/2020  Primary Care Provider: Asencion Noble, MD [facility] Cardiologist:   Patient Profile:   Kardell Virgil. is a 69 y.o. male with a hx of HTN who is being seen today for the evaluation of chest pressure and palpitations  at the request of Dr Athena Masse  History of Present Illness:   Mr. Amsden is a 69 yo with hx of HL and HTN who presents with CP   The pt has no known cardiac problems   He says a few wks ago he noted palpitations   Short lived  About 1 week ago had a spell that was longer  Accompanied by chest pressure  Lasted about 20 min   PRessure bilateral    Did not slight dizziness when heart slowed    Followed up with Dr Willey Blade  Just turned in a 3 day Zio patch  Didn't have any spells while wearing  Today around lunch time he was on the  phone when started again    Associated with chest  pressure    Came to ED for further evaluation  Currently he denies chest pressure but can feel HR pick up   Little fullness and fullness in head when heart rate is fast    Past Medical History:  Diagnosis Date  . Allergy   . Anxiety   . Depression   . Diabetes mellitus without complication ([place])   . GERD (gastroesophageal reflux disease)   . Hyperlipidemia   . Hypertension   . Pre-diabetes     Past Surgical History:  Procedure Laterality Date  . APPENDECTOMY    . VASECTOMY      Inpatient Medications: Scheduled Meds:  Continuous Infusions: . sodium chloride     PRN Meds:   Allergies:    Allergies  Allergen Reactions  . Macrodantin [Nitrofurantoin Macrocrystal] Hives  . Clindamycin/Lincomycin Hives  . Septra [Sulfamethoxazole-Trimethoprim] Hives    Social History:   Social History   Socioeconomic History  . Marital status: Married    Spouse name: Not on file  . Number of children: Not on file  . Years of education: Not on file   . Highest education level: Not on file  Occupational History  . Not on file  Tobacco Use  . Smoking status: Never Smoker  . Smokeless tobacco: Never Used  Vaping Use  . Vaping Use: Never used  Substance and Sexual Activity  . Alcohol use: Yes    Comment: Moderately  . Drug use: No  . Sexual activity: Not on file  Other Topics Concern  . Not on file  Social History Narrative  . Not on file   Social Determinants of Health   Financial Resource Strain:   . Difficulty of Paying Living Expenses: Not on file  Food Insecurity:   . Worried About Charity fundraiser in the Last Year: Not on file  . Ran Out of Food in the Last Year: Not on file  Transportation Needs:   . Lack of Transportation (Medical): Not on file  . Lack of Transportation (Non-Medical): Not on file  Physical Activity:   . Days of Exercise per Week: Not on file  . Minutes of Exercise per Session: Not on file  Stress:   . Feeling of Stress : Not on file  Social Connections:   . Frequency of Communication with Friends and Family: Not  on file  . Frequency of Social Gatherings with Friends and Family: Not on file  . Attends Religious Services: Not on file  . Active Member of Clubs or Organizations: Not on file  . Attends Archivist Meetings: Not on file  . Marital Status: Not on file  Intimate Partner Violence:   . Fear of Current or Ex-Partner: Not on file  . Emotionally Abused: Not on file  . Physically Abused: Not on file  . Sexually Abused: Not on file    Family History:    Family History  Problem Relation Age of Onset  . Diabetes Mother   . Heart attack Father   . Colon cancer Neg Hx   . Esophageal cancer Neg Hx   . Liver cancer Neg Hx   . Pancreatic cancer Neg Hx   . Rectal cancer Neg Hx   . Stomach cancer Neg Hx      ROS:  Please see the history of present illness.   All other ROS reviewed and negative.     Physical Exam/Data:   Vitals:   06/10/20 1415 06/10/20 1430 06/10/20  1445 06/10/20 1500  BP:   131/86 134/83  Pulse: 84 (!) 121 75 81  Resp: 10 13 20  (!) 21  Temp:      TempSrc:      SpO2: 100% 100% 100% 100%  Weight:      Height:        Intake/Output Summary (Last 24 hours) at 06/10/2020 1527 Last data filed at 06/10/2020 1446 Gross per 24 hour  Intake --  Output 700 ml  Net -700 ml   Last 3 Weights 06/10/2020 05/31/2020 02/04/2018  Weight (lbs) 194 lb 194 lb 209 lb  Weight (kg) 87.998 kg 87.998 kg 94.802 kg     Body mass index is 27.06 kg/m.  General:  Well nourished, well developed, in no acute distress HEENT: normal Lymph: no adenopathy Neck: no JVD Endocrine:  No thryomegaly Vascular: No carotid bruits; FA pulses 2+ bilaterally without bruits  Cardiac:  normal S1, S2; RRR with skps ; no murmur  Distant   Lungs:  clear to auscultation bilaterally, no wheezing, rhonchi or rales  Abd: soft, nontender, no hepatomegaly  Ext: no edema Musculoskeletal:  No deformities, BUE and BLE strength normal and equal Skin: warm and dry  Neuro:  CNs 2-12 intact, no focal abnormalities noted Psych:  Normal affect   EKG:  The EKG was personally reviewed and demonstrates:    #1  SR   LVH   # 2  SVT  173 bpm   ST T waVe changes, consider ischeima  Telemetry:  Telemetry was personally reviewed and demonstrates:   Sinus rhythm with salvos of tachycardia  Limited    Relevant CV Studies:   Laboratory Data:  High Sensitivity Troponin:   Recent Labs  Lab 06/10/20 1335  TROPONINIHS 2     Chemistry Recent Labs  Lab 06/10/20 1335  NA 140  K 4.1  CL 103  CO2 26  GLUCOSE 111*  BUN 15  CREATININE 1.01  CALCIUM 9.6  GFRNONAA >60  ANIONGAP 11    No results for input(s): PROT, ALBUMIN, AST, ALT, ALKPHOS, BILITOT in the last 168 hours. Hematology Recent Labs  Lab 06/10/20 1335  WBC 6.9  RBC 5.16  HGB 16.0  HCT 47.6  MCV 92.2  MCH 31.0  MCHC 33.6  RDW 12.2  PLT 292   BNPNo results for input(s): BNP, PROBNP in the last 168  hours.   DDimer No results for input(s): DDIMER in the last 168 hours.   Radiology/Studies:  DG Chest 2 View  Result Date: 06/10/2020 CLINICAL DATA:  Chest pain EXAM: CHEST - 2 VIEW COMPARISON:  2015 FINDINGS: The heart size and mediastinal contours are within normal limits. Both lungs are clear. No pleural effusion or pneumothorax. The visualized skeletal structures are unremarkable. IMPRESSION: No acute process in the chest. Electronically Signed   By: Macy Mis M.D.   On: 06/10/2020 13:11     Assessment and Plan:   1  SVT   PT with SVT this AM that was prolonged   Broke on own   Some chest pressure and dizzienss but no syncope    Since that time he has had many salvos of SVT that are limited    He senses but short lived    Currently he has gotten metoprolol x 2 IV      I would recomm trial of IV diltiazem   Follow response on tele Goal would be to control with meds   If he continues to have despite medical Rx will need to have EP eval for possible ablation  WOuld get echo in AM Check TSH Try to get records from [employer] may still be in transit to company    2  Hx HTN  Hold ACE I    Follow BP on dilt  3   Hx HL   Check lipdis in AM   Did not tolerate lipitor or crestor in past   Has lost weight but A1C is still above 7     On Zetia now  4  Type II DM    Discussed diet     For questions or updates, please contact [facility] Please consult [email or web address] for contact info under    Signed, Dorris Carnes, MD  06/10/2020 3:27 PM

## 2020-06-10 NOTE — ED Notes (Signed)
Called lab and inquired about repeat troponin. Lab reported should crossover into chart in approximately 10 minutes.

## 2020-06-10 NOTE — ED Provider Notes (Signed)
Patient seen and examined, agree with assessment and plan by APP. Patient with palpitations. Iniitially in NSR but having intermittent episodes of narrow complex tachycardia, rate around 140-160, SVT vs 2:1 flutter, but self terminates with valsalva   Truddie Hidden, MD 06/10/20 1440

## 2020-06-10 NOTE — ED Triage Notes (Signed)
States his heart is fluttering

## 2020-06-10 NOTE — ED Notes (Signed)
Attempted to obtain blood X 2, unsuccessful.

## 2020-06-10 NOTE — ED Provider Notes (Signed)
[facility] EMERGENCY DEPARTMENT Provider Note   CSN: [id number] Arrival date & time: 06/10/20  1229     History Chief Complaint  Patient presents with  . Chest Pain    Edward Armstrong. is a 69 y.o. male history includes GERD, diabetes, hypertension, hyperlipidemia. Patient presents today for palpitations, this has been an intermittent problem for the past 2 weeks. Patient was seen in urgent care 2 weeks ago, was then sent to his PCP who prescribed him a Zio patch, he has recently completed the Zio patch and sent it back to his PCPs office but has not obtained any results yet. He reports today around lunchtime his rotations returned and have been more frequent. This is associated with a mild tightness in the center of his chest nonradiating improved when the palpitations cease.  Denies headache, vision changes, lightheadedness, shortness of breath, nausea/vomiting, abdominal pain, diaphoresis, numbness/tingling weakness, extremity swelling/color change or any additional concerns.  HPI     Past Medical History:  Diagnosis Date  . Allergy   . Anxiety   . Depression   . Diabetes mellitus without complication ([place])   . GERD (gastroesophageal reflux disease)   . Hyperlipidemia   . Hypertension   . Pre-diabetes     Patient Active Problem List   Diagnosis Date Noted  . Left shoulder pain 02/21/2013  . Rotator cuff syndrome of left shoulder 02/21/2013    Past Surgical History:  Procedure Laterality Date  . APPENDECTOMY    . VASECTOMY         Family History  Problem Relation Age of Onset  . Diabetes Mother   . Heart attack Father   . Colon cancer Neg Hx   . Esophageal cancer Neg Hx   . Liver cancer Neg Hx   . Pancreatic cancer Neg Hx   . Rectal cancer Neg Hx   . Stomach cancer Neg Hx     Social History   Tobacco Use  . Smoking status: Never Smoker  . Smokeless tobacco: Never Used  Vaping Use  . Vaping Use: Never used  Substance Use Topics  . Alcohol use:  Yes    Comment: Moderately  . Drug use: No    Home Medications Prior to Admission medications   Medication Sig Start Date End Date Taking? Authorizing Provider  ALPRAZolam Duanne Moron) 0.25 MG tablet Take 0.25 mg by mouth in the morning and at bedtime.    Yes [provider]  aspirin EC 81 MG tablet Take 81 mg by mouth daily.   Yes [provider]  enalapril (VASOTEC) 20 MG tablet Take 10 mg by mouth daily.    Yes [provider]  esomeprazole (NEXIUM) 40 MG capsule Take 40 mg by mouth daily at 12 noon.   Yes [provider]  ezetimibe (ZETIA) 10 MG tablet Take 10 mg by mouth daily.   Yes [provider]  metFORMIN (GLUCOPHAGE) 500 MG tablet Take 1,000 mg by mouth 2 (two) times daily with a meal.    Yes [provider]  Multiple Vitamin (MULTIVITAMIN) tablet Take 1 tablet by mouth daily.   Yes [provider]  Multiple Vitamins-Minerals (VITAMIN C EFFERVESCENT BLEND PO) Take 1 packet by mouth daily.   Yes [provider]  cycloSPORINE (RESTASIS) 0.05 % ophthalmic emulsion Place 1 drop into both eyes 2 (two) times daily. Patient not taking: Reported on 06/10/2020    [provider]    Allergies    Macrodantin [nitrofurantoin macrocrystal], Clindamycin/lincomycin,  and Septra [sulfamethoxazole-trimethoprim]  Review of Systems   Review of Systems Ten systems are reviewed and are negative for acute change except as noted in the HPI  Physical Exam Updated Vital Signs BP 134/77   Pulse 71   Temp 98.1 F (36.7 C) (Oral)   Resp 14   Ht 5\' 11"  (1.803 m)   Wt 88 kg   SpO2 100%   BMI 27.06 kg/m   Physical Exam Constitutional:      General: He is not in acute distress.    Appearance: Normal appearance. He is well-developed. He is not ill-appearing or diaphoretic.  HENT:     Head: Normocephalic and atraumatic.  Eyes:     General: Vision grossly intact. Gaze aligned appropriately.     Pupils: Pupils are equal,  round, and reactive to light.  Neck:     Trachea: Trachea and phonation normal.  Cardiovascular:     Rate and Rhythm: Normal rate and regular rhythm.  Pulmonary:     Effort: Pulmonary effort is normal. No respiratory distress.     Breath sounds: Normal breath sounds.  Abdominal:     General: There is no distension.     Palpations: Abdomen is soft.     Tenderness: There is no abdominal tenderness. There is no guarding or rebound.  Musculoskeletal:        General: Normal range of motion.     Cervical back: Normal range of motion.     Right lower leg: No tenderness. No edema.     Left lower leg: No tenderness. No edema.  Skin:    General: Skin is warm and dry.  Neurological:     Mental Status: He is alert.     GCS: GCS eye subscore is 4. GCS verbal subscore is 5. GCS motor subscore is 6.     Comments: Speech is clear and goal oriented, follows commands Major Cranial nerves without deficit, no facial droop Moves extremities without ataxia, coordination intact  Psychiatric:        Behavior: Behavior normal.     ED Results / Procedures / Treatments   Labs (all labs ordered are listed, but only abnormal results are displayed) Labs Reviewed  BASIC METABOLIC PANEL - Abnormal; Notable for the following components:      Result Value   Glucose, Bld 111 (*)    All other components within normal limits  RESPIRATORY PANEL BY RT PCR (FLU A&B, COVID)  CBC  MAGNESIUM  TROPONIN I (HIGH SENSITIVITY)  TROPONIN I (HIGH SENSITIVITY)    EKG EKG Interpretation  Date/Time:  Monday June 10 2020 12:38:41 EDT Ventricular Rate:  92 PR Interval:  180 QRS Duration: 90 QT Interval:  328 QTC Calculation: 405 R Axis:   38 Text Interpretation: Normal sinus rhythm Minimal voltage criteria for LVH, may be normal variant ( R in aVL ) Cannot rule out Inferior infarct , age undetermined Abnormal ECG No significant change since last tracing Rate slower Confirmed by Calvert Cantor [phone]) on  06/10/2020 1:07:31 PM   Radiology DG Chest 2 View  Result Date: 06/10/2020 CLINICAL DATA:  Chest pain EXAM: CHEST - 2 VIEW COMPARISON:  2015 FINDINGS: The heart size and mediastinal contours are within normal limits. Both lungs are clear. No pleural effusion or pneumothorax. The visualized skeletal structures are unremarkable. IMPRESSION: No acute process in the chest. Electronically Signed   By: Macy Mis M.D.   On: 06/10/2020 13:11    Procedures .Critical Care Performed by: Nuala Alpha  A, PA-C Authorized by: Deliah Boston, PA-C   Critical care provider statement:    Critical care time (minutes):  40   Critical care was time spent personally by me on the following activities:  Discussions with consultants, evaluation of patient's response to treatment, examination of patient, ordering and performing treatments and interventions, ordering and review of laboratory studies, ordering and review of radiographic studies, pulse oximetry, re-evaluation of patient's condition, obtaining history from patient or surrogate, review of old charts and development of treatment plan with patient or surrogate   (including critical care time)  Medications Ordered in ED Medications  diltiazem (CARDIZEM) 1 mg/mL load via infusion 10 mg (has no administration in time range)    And  diltiazem (CARDIZEM) 125 mg in dextrose 5% 125 mL (1 mg/mL) infusion (has no administration in time range)  metoprolol tartrate (LOPRESSOR) injection 5 mg (5 mg Intravenous Given 06/10/20 1452)  metoprolol tartrate (LOPRESSOR) injection 5 mg (5 mg Intravenous Given 06/10/20 1515)    ED Course  I have reviewed the triage vital signs and the nursing notes.  Pertinent labs & imaging results that were available during my care of the patient were reviewed by me and considered in my medical decision making (see chart for details).  Clinical Course as of Jun 10 1846  Mon Jun 10, 2020  1439 Dr. Harrington Challenger   [BM]  1439  Decrease Enalpril   [BM]  1440 5mg  IV Lopressor now, 5mg  more in a few minutes   [BM]    Clinical Course User Index [BM] Gari Crown   MDM Rules/Calculators/A&P                         Additional history obtained from: 1. Nursing notes from this visit. 2. Electronic medical record review. ------ I ordered, reviewed and interpreted labs which include: Magnesium within normal limits. CBC within normal limits. I-STAT troponin within normal limits x2. BMP shows no emergent electrolyte derangement, AKI or gap.  CXR:  IMPRESSION:  No acute process in the chest.   EKG: Normal sinus rhythm Minimal voltage criteria for LVH, may be normal variant ( R in aVL ) Cannot rule out Inferior infarct , age undetermined Abnormal ECG No significant change since last tracing Rate slower Confirmed by Calvert Cantor [phone]) on 06/10/2020 1:07:31 PM - Shortly after initial EKG was obtained and initial evaluation patient reported palpitations returned. Repeat EKG shows narrow complex tachycardia rate around 170 bpm. SVT initially resolved with Valsalva however after 1-2 minutes it returned. Discussed the case with cardiologist Dr. Harrington Challenger, advised 5 mg IV Lopressor x2 spaced a few minutes apart. Cardiology to see patient. - Patient had continued tachycardia after Lopressor, rediscussed case with cardiologist Dr. Harrington Challenger, advised Cardizem drip and admission to hospitalist service they plan for echocardiogram and further studies tomorrow morning. - Patient was reassessed he is resting comfortably no acute distress vital signs stable heart rate around 90 bpm. He is agreeable to admission and further work-up. - Discussed the case with Dr. Jonnie Finner and patient was accepted to hospitalist service.   Note: Portions of this report may have been transcribed using voice recognition software. Every effort was made to ensure accuracy; however, inadvertent computerized transcription errors may still be  present. Final Clinical Impression(s) / ED Diagnoses Final diagnoses:  SVT (supraventricular tachycardia) ([place])    Rx / DC Orders ED Discharge Orders    None       Nuala Alpha  A, PA-C 06/10/20 1848    Truddie Hidden, MD 06/11/20 [phone]

## 2020-06-10 NOTE — H&P (Signed)
[facility] History & Physical  Rob Doctor, hospital MD  Jovita Gamma. 06/10/2020  Chief Complaint:  HPI: The patient is a 69 y.o. year-old w/ hx of HTN, HL, DM2, anxiety/ depressoin presented to ED today for palpitations and chest pressure. Pt had no cardiac history, does have HTN.  Noted palpitations starting a few wks ago. 1 week ago had a longer spell that was associated w/ chest pressure, lasted 20 min. Today had another episode so came to ED.  In ED eval normal CBC and normal BMET.  HR 90's w/ salvo's of SVT.  CXR negative. Troponins wnl. EKG initially was NSR, repeat about 1:45 pm was SVT at 173 bpm. He received IV metoprolol 5 mg x 2 in ED.   Seen by cardiology and started on IV diltiazem w/ bolus then gtt.  Asked that pt be admitted to hospitalist service and cardiology will follow.    Pt seen in ED.  No c/o's now, no active chest pain.  Monitor showing intermittent irregular rhythm alternating w/ NSR , rates are in the 70's- 90's now on IV dilt gtt.  Pt asymptomatic.    Denies hx of CAD, stents, MI, etc.  No sob , edema.    Taking zetia because statins caused "my legs to hurt".    No tobacco or sig etoh. Active, plays pickle-ball, played tennis when he was younger.   ROS  no joint pain   no HA  no blurry vision  no rash  no diarrhea  no nausea/ vomiting  no dysuria  no difficulty voiding  no change in urine color   Past Medical History  Past Medical History:  Diagnosis Date  . Allergy   . Anxiety   . Depression   . Diabetes mellitus without complication ([place])   . GERD (gastroesophageal reflux disease)   . Hyperlipidemia   . Hypertension   . Pre-diabetes    Past Surgical History  Past Surgical History:  Procedure Laterality Date  . APPENDECTOMY    . VASECTOMY     Family History  Family History  Problem Relation Age of Onset  . Diabetes Mother   . Heart attack Father   . Colon cancer Neg Hx   . Esophageal cancer Neg Hx   . Liver cancer Neg Hx   .  Pancreatic cancer Neg Hx   . Rectal cancer Neg Hx   . Stomach cancer Neg Hx    Social History  reports that he has never smoked. He has never used smokeless tobacco. He reports current alcohol use. He reports that he does not use drugs. Allergies  Allergies  Allergen Reactions  . Macrodantin [Nitrofurantoin Macrocrystal] Hives  . Clindamycin/Lincomycin Hives  . Septra [Sulfamethoxazole-Trimethoprim] Hives   Home medications Prior to Admission medications   Medication Sig Start Date End Date Taking? Authorizing Provider  ALPRAZolam Duanne Moron) 0.25 MG tablet Take 0.25 mg by mouth in the morning and at bedtime.    Yes [provider]  aspirin EC 81 MG tablet Take 81 mg by mouth daily.   Yes [provider]  enalapril (VASOTEC) 20 MG tablet Take 10 mg by mouth daily.    Yes [provider]  esomeprazole (NEXIUM) 40 MG capsule Take 40 mg by mouth daily at 12 noon.   Yes [provider]  ezetimibe (ZETIA) 10 MG tablet Take 10 mg by mouth daily.   Yes [provider]  metFORMIN (GLUCOPHAGE) 500 MG tablet Take 1,000 mg by mouth 2 (  two) times daily with a meal.    Yes [provider]  Multiple Vitamin (MULTIVITAMIN) tablet Take 1 tablet by mouth daily.   Yes [provider]  Multiple Vitamins-Minerals (VITAMIN C EFFERVESCENT BLEND PO) Take 1 packet by mouth daily.   Yes [provider]  cycloSPORINE (RESTASIS) 0.05 % ophthalmic emulsion Place 1 drop into both eyes 2 (two) times daily. Patient not taking: Reported on 06/10/2020    [provider]       Exam Gen WD and WN, alert and pleasant, no distress No rash, cyanosis or gangrene Sclera anicteric, throat clear  No jvd or bruits Chest clear bilat , no rales or wheezing RRR no MRG  Abd soft ntnd no mass or ascites +bs GU normal male MS no joint effusions or deformity Ext no LE edema, no wounds or ulcers Neuro is alert, Ox 3 , nf    Home meds:  - asa 81/  enalapril 10 qd/ zetia 10 qd  - metformin 1gm bid ac  - xanax 0.25 bid  - nexium 40 qd  - prn's/ vitamins/ supplements    EKG in ED 1238 pm > Normal sinus rhythm Minimal voltage criteria for LVH, may be normal variant ( R in aVL ) Cannot rule out Inferior infarct , age undetermined    EKG in ED 13:44 pm > Supraventricular tachycardia, HR 172 Repolarization abnormality, prob rate related     BP's 120- 140 / 79- 89 in ED   COVID pending  Assessment/ Plan: 1. SVT - subacute onset over last few weeks, worse today.  Got IV metoprolol x 2 in ED and now getting IV diltiazem gtt. SVT salvos seem to be under control now.  Admit to SDU.  ECHO in am. May need EP eval for ablation if not responding to medical Rx.  Cardiology to follow.  2. HTN - hold vasotec per cardiology, watch BP on diltiazem 3. HL - cont zetia, hx of adverse rxn to statin x 2 4. DM 2- on po agents, plan carb mod diet and SSI prn      Kelly Splinter  MD 06/10/2020, 10:29 PM

## 2020-06-11 ENCOUNTER — Observation Stay (HOSPITAL_BASED_OUTPATIENT_CLINIC_OR_DEPARTMENT_OTHER): Payer: Medicare Other

## 2020-06-11 DIAGNOSIS — R9431 Abnormal electrocardiogram [ECG] [EKG]: Secondary | ICD-10-CM | POA: Diagnosis not present

## 2020-06-11 DIAGNOSIS — I471 Supraventricular tachycardia: Secondary | ICD-10-CM | POA: Diagnosis not present

## 2020-06-11 LAB — ECHOCARDIOGRAM COMPLETE
Area-P 1/2: 3.77 cm2
Height: 71 in
S' Lateral: 2.83 cm
Weight: 3104 oz

## 2020-06-11 LAB — HIV ANTIBODY (ROUTINE TESTING W REFLEX): HIV Screen 4th Generation wRfx: NONREACTIVE

## 2020-06-11 LAB — RESPIRATORY PANEL BY RT PCR (FLU A&B, COVID)
Influenza A by PCR: NEGATIVE
Influenza B by PCR: NEGATIVE
SARS Coronavirus 2 by RT PCR: NEGATIVE

## 2020-06-11 MED ORDER — DILTIAZEM HCL ER COATED BEADS 120 MG PO CP24: 120.0000 mg | ORAL_CAPSULE | Freq: Every day | ORAL | 1 refills | Status: DC

## 2020-06-11 NOTE — Discharge Instructions (Signed)
Advised to follow-up with primary care physician in 1 week. Advised to take Cardizem CD 125 mg daily. Advised to hold enalapril for now. Advised to follow-up with cardiology in 1 to 2 weeks.

## 2020-06-11 NOTE — Care Management Obs Status (Signed)
[facility] NOTIFICATION   Patient Details  Name: Edward Armstrong. MRN: [id number] Date of Birth: [birth year]   Medicare Observation Status Notification Given:  Yes (letter mailed to [id number], [place], [place] [id number])    Tommy Medal 06/11/2020, 3:47 PM

## 2020-06-11 NOTE — Discharge Summary (Signed)
Physician Discharge Summary  Edward Armstrong. FWY:[id number] DOB: [birth year] DOA: 06/10/2020  PCP: Asencion Noble, MD  Admit date: 06/10/2020   Discharge date: 06/11/2020  Admitted From: Home.  Disposition:  Home.  Recommendations for Outpatient Follow-up:  1. Follow up with PCP in 1-2 weeks 2. Please obtain BMP/CBC in one week 3.   Advised to take Cardizem CD 120 mg daily. 4.   Advised to hold enalapril for now. 5.   Advised to follow-up with cardiology in 1 to 2 weeks.  Home Health: None.  Equipment/Devices:None  Discharge Condition: Stable. CODE STATUS:Full code. Diet recommendation: Heart Healthy.  Brief Summary / Hospital course: This 69 years old male with medical history significant for hypertension, hyperlipidemia, diabetes, anxiety, depression presents in the emergency department with chest pressure and intermittent palpitations.  He reports prolonged episode of intermittent palpitation lasted 20 minutes yesterday. In the ED initial EKG: Normal.sinus rhythm,  repeat EKG shows SVT with rate 173.  Patient was given metoprolol IV 5 mg twice.  Patient was seen by cardiology,  recommended to start Cardizem drip.  Patient was admitted for observation for palpitations found to have paroxysmal supraventricular tachycardia..  Echocardiogram unremarkable, no valvular abnormalities.  Heart rate remains well controlled afterwards, TSH normal,  troponins normal.  Patient cleared from cardiology to be discharged on Cardizem CD 120 mg daily.  Patient was advised to hold Vasotec for now and follow-up with cardiology in 1 to 2 weeks.  Patient is being discharged from the ED.  Discharge Diagnoses:  Active Problems:   SVT (supraventricular tachycardia) (HCC)   Palpitations   Chest pressure   Essential hypertension   Hyperlipidemia   Diabetes mellitus type 2, noninsulin dependent [facility])    Discharge Instructions  Discharge Instructions    Call MD for:  persistant dizziness or  light-headedness   Complete by: As directed    Call MD for:  persistant nausea and vomiting   Complete by: As directed    Diet - low sodium heart healthy   Complete by: As directed    Diet Carb Modified   Complete by: As directed    Discharge instructions   Complete by: As directed    Advised to follow-up with primary care physician in 1 week. Advised to take Cardizem CD 125 mg daily. Advised to hold enalapril for now. Advised to follow-up with cardiology in 1 to 2 weeks.   Increase activity slowly   Complete by: As directed      Allergies as of 06/11/2020      Reactions   Macrodantin [nitrofurantoin Macrocrystal] Hives   Clindamycin/lincomycin Hives   Septra [sulfamethoxazole-trimethoprim] Hives      Medication List    STOP taking these medications   cycloSPORINE 0.05 % ophthalmic emulsion Commonly known as: RESTASIS   enalapril 20 MG tablet Commonly known as: VASOTEC     TAKE these medications   ALPRAZolam 0.25 MG tablet Commonly known as: XANAX Take 0.25 mg by mouth in the morning and at bedtime.   aspirin EC 81 MG tablet Take 81 mg by mouth daily.   diltiazem 120 MG 24 hr capsule Commonly known as: Cardizem CD Take 1 capsule (120 mg total) by mouth daily.   esomeprazole 40 MG capsule Commonly known as: NEXIUM Take 40 mg by mouth daily at 12 noon.   ezetimibe 10 MG tablet Commonly known as: ZETIA Take 10 mg by mouth daily.   metFORMIN 500 MG tablet Commonly known as: GLUCOPHAGE Take 1,000 mg by mouth  2 (two) times daily with a meal.   multivitamin tablet Take 1 tablet by mouth daily.   VITAMIN C EFFERVESCENT BLEND PO Take 1 packet by mouth daily.       Follow-up Information    Asencion Noble, MD Follow up in 1 week(s).   Specialty: Internal Medicine Contact information: [address] [place] [address] [phone]        Fay Records, MD .   Specialty: Cardiology Contact information: [address] [place]  [address] [phone]              Allergies  Allergen Reactions  . Macrodantin [Nitrofurantoin Macrocrystal] Hives  . Clindamycin/Lincomycin Hives  . Septra [Sulfamethoxazole-Trimethoprim] Hives    Consultations:  Cardiology   Procedures/Studies: DG Chest 2 View  Result Date: 06/10/2020 CLINICAL DATA:  Chest pain EXAM: CHEST - 2 VIEW COMPARISON:  2015 FINDINGS: The heart size and mediastinal contours are within normal limits. Both lungs are clear. No pleural effusion or pneumothorax. The visualized skeletal structures are unremarkable. IMPRESSION: No acute process in the chest. Electronically Signed   By: Macy Mis M.D.   On: 06/10/2020 13:11   ECHOCARDIOGRAM COMPLETE  Result Date: 06/11/2020    ECHOCARDIOGRAM REPORT   Patient Name:   Edward Armstrong. Date of Exam: 06/11/2020 Medical Rec #:  [id number]         Height:       71.0 in Accession #:    [id number]        Weight:       194.0 lb Date of Birth:  December 12, 1950         BSA:          2.081 m Patient Age:    57 years          BP:           109/53 mmHg Patient Gender: M                 HR:           62 bpm. Exam Location:  [facility] Procedure: 2D Echo Indications:    Abnormal ECG 794.31 / R94.31  History:        Patient has no prior history of Echocardiogram examinations.                 Risk Factors:Hypertension, Dyslipidemia, Diabetes and                 Non-Smoker. Chest Pressure, Left Shoulder Pain, SVT.  Sonographer:    Leavy Cella RDCS (AE) Referring Phys: [id number] PAULA V ROSS IMPRESSIONS  1. Left ventricular ejection fraction, by estimation, is 55 to 60%. The left ventricle has normal function. The left ventricle has no regional wall motion abnormalities. There is mild left ventricular hypertrophy. Left ventricular diastolic parameters are indeterminate.  2. Right ventricular systolic function is normal. The right ventricular size is normal. Tricuspid regurgitation signal is inadequate for assessing PA pressure.  3. The  mitral valve is grossly normal. Trivial mitral valve regurgitation.  4. The aortic valve is tricuspid. Aortic valve regurgitation is not visualized.  5. The inferior vena cava is normal in size with greater than 50% respiratory variability, suggesting right atrial pressure of 3 mmHg. FINDINGS  Left Ventricle: Left ventricular ejection fraction, by estimation, is 55 to 60%. The left ventricle has normal function. The left ventricle has no regional wall motion abnormalities. The left ventricular internal cavity size was normal in  size. There is  mild left ventricular hypertrophy. Left ventricular diastolic parameters are indeterminate. Right Ventricle: The right ventricular size is normal. No increase in right ventricular wall thickness. Right ventricular systolic function is normal. Tricuspid regurgitation signal is inadequate for assessing PA pressure. Left Atrium: Left atrial size was normal in size. Right Atrium: Right atrial size was normal in size. Pericardium: There is no evidence of pericardial effusion. Mitral Valve: The mitral valve is grossly normal. Mild mitral annular calcification. Trivial mitral valve regurgitation. Tricuspid Valve: The tricuspid valve is grossly normal. Tricuspid valve regurgitation is trivial. Aortic Valve: The aortic valve is tricuspid. There is mild aortic valve annular calcification. Aortic valve regurgitation is not visualized. Pulmonic Valve: The pulmonic valve was grossly normal. Pulmonic valve regurgitation is trivial. Aorta: The aortic root is normal in size and structure. Venous: The inferior vena cava is normal in size with greater than 50% respiratory variability, suggesting right atrial pressure of 3 mmHg. IAS/Shunts: No atrial level shunt detected by color flow Doppler.  LEFT VENTRICLE PLAX 2D LVIDd:         4.11 cm  Diastology LVIDs:         2.83 cm  LV e' medial:    6.09 cm/s LV PW:         1.17 cm  LV E/e' medial:  9.6 LV IVS:        1.11 cm  LV e' lateral:   10.80  cm/s LVOT diam:     1.80 cm  LV E/e' lateral: 5.4 LVOT Area:     2.54 cm  RIGHT VENTRICLE RV S prime:     10.10 cm/s TAPSE (M-mode): 2.2 cm LEFT ATRIUM             Index       RIGHT ATRIUM           Index LA diam:        3.90 cm 1.87 cm/m  RA Area:     16.60 cm LA Vol (A2C):   39.3 ml 18.88 ml/m RA Volume:   43.00 ml  20.66 ml/m LA Vol (A4C):   30.0 ml 14.41 ml/m LA Biplane Vol: 35.9 ml 17.25 ml/m   AORTA Ao Root diam: 3.10 cm MITRAL VALVE MV Area (PHT): 3.77 cm    SHUNTS MV Decel Time: 201 msec    Systemic Diam: 1.80 cm MV E velocity: 58.70 cm/s MV A velocity: 67.70 cm/s MV E/A ratio:  0.87 Rozann Lesches MD Electronically signed by Rozann Lesches MD Signature Date/Time: 06/11/2020/1:57:07 PM    Final   Echocardiogram   Subjective: Patient was seen and examined in the ED.  Overnight events noted.  Patient reports feeling much better,  denies any further palpitations. HR remains controlled.  Patient is being discharged home.  Discharge Exam: Vitals:   06/11/20 0500 06/11/20 0600  BP: 97/66 (!) 109/53  Pulse: (!) 52 62  Resp: 15 15  Temp:    SpO2: 96% 95%   Vitals:   06/10/20 2304 06/11/20 0019 06/11/20 0500 06/11/20 0600  BP:  111/71 97/66 (!) 109/53  Pulse:  (!) 54 (!) 52 62  Resp:  15 15 15   Temp:      TempSrc:      SpO2: 95% 97% 96% 95%  Weight:      Height:        General: Pt is alert, awake, not in acute distress Cardiovascular: RRR, S1/S2 +, no rubs, no gallops Respiratory: CTA bilaterally, no wheezing, no  rhonchi Abdominal: Soft, NT, ND, bowel sounds + Extremities: no edema, no cyanosis    The results of significant diagnostics from this hospitalization (including imaging, microbiology, ancillary and laboratory) are listed below for reference.     Microbiology: Recent Results (from the past 240 hour(s))  Respiratory Panel by RT PCR (Flu A&B, Covid) - Nasopharyngeal Swab     Status: None   Collection Time: 06/11/20 12:25 AM   Specimen: Nasopharyngeal Swab   Result Value Ref Range Status   SARS Coronavirus 2 by RT PCR NEGATIVE NEGATIVE Final    Comment: (NOTE) SARS-CoV-2 target nucleic acids are NOT DETECTED.  The SARS-CoV-2 RNA is generally detectable in upper respiratoy specimens during the acute phase of infection. The lowest concentration of SARS-CoV-2 viral copies this assay can detect is 131 copies/mL. A negative result does not preclude SARS-Cov-2 infection and should not be used as the sole basis for treatment or other patient management decisions. A negative result may occur with  improper specimen collection/handling, submission of specimen other than nasopharyngeal swab, presence of viral mutation(s) within the areas targeted by this assay, and inadequate number of viral copies (<131 copies/mL). A negative result must be combined with clinical observations, patient history, and epidemiological information. The expected result is Negative.  Fact Sheet for Patients:  PinkCheek.be  Fact Sheet for Healthcare Providers:  GravelBags.it  This test is no t yet approved or cleared by the [place] FDA and  has been authorized for detection and/or diagnosis of SARS-CoV-2 by FDA under an Emergency Use Authorization (EUA). This EUA will remain  in effect (meaning this test can be used) for the duration of the COVID-19 declaration under Section 564(b)(1) of the Act, 21 U.S.C. section 360bbb-3(b)(1), unless the authorization is terminated or revoked sooner.     Influenza A by PCR NEGATIVE NEGATIVE Final   Influenza B by PCR NEGATIVE NEGATIVE Final    Comment: (NOTE) The Xpert Xpress SARS-CoV-2/FLU/RSV assay is intended as an aid in  the diagnosis of influenza from Nasopharyngeal swab specimens and  should not be used as a sole basis for treatment. Nasal washings and  aspirates are unacceptable for Xpert Xpress SARS-CoV-2/FLU/RSV  testing.  Fact Sheet for  Patients: PinkCheek.be  Fact Sheet for Healthcare Providers: GravelBags.it  This test is not yet approved or cleared by the [place] FDA and  has been authorized for detection and/or diagnosis of SARS-CoV-2 by  FDA under an Emergency Use Authorization (EUA). This EUA will remain  in effect (meaning this test can be used) for the duration of the  Covid-19 declaration under Section 564(b)(1) of the Act, 21  U.S.C. section 360bbb-3(b)(1), unless the authorization is  terminated or revoked. Performed at [facility], [address], [place], [place] [address]      Labs: BNP (last 3 results) No results for input(s): BNP in the last 8760 hours. Basic Metabolic Panel: Recent Labs  Lab 06/10/20 1335 06/10/20 1357  NA 140  --   K 4.1  --   CL 103  --   CO2 26  --   GLUCOSE 111*  --   BUN 15  --   CREATININE 1.01  --   CALCIUM 9.6  --   MG  --  1.9   Liver Function Tests: No results for input(s): AST, ALT, ALKPHOS, BILITOT, PROT, ALBUMIN in the last 168 hours. No results for input(s): LIPASE, AMYLASE in the last 168 hours. No results for input(s): AMMONIA in the last 168 hours. CBC:  Recent Labs  Lab 06/10/20 1335  WBC 6.9  HGB 16.0  HCT 47.6  MCV 92.2  PLT 292   Cardiac Enzymes: No results for input(s): CKTOTAL, CKMB, CKMBINDEX, TROPONINI in the last 168 hours. BNP: Invalid input(s): POCBNP CBG: No results for input(s): GLUCAP in the last 168 hours. D-Dimer No results for input(s): DDIMER in the last 72 hours. Hgb A1c No results for input(s): HGBA1C in the last 72 hours. Lipid Profile No results for input(s): CHOL, HDL, LDLCALC, TRIG, CHOLHDL, LDLDIRECT in the last 72 hours. Thyroid function studies Recent Labs    06/10/20 1335  TSH 1.701   Anemia work up No results for input(s): VITAMINB12, FOLATE, FERRITIN, TIBC, IRON, RETICCTPCT in the last 72 hours. Urinalysis No results found for: COLORURINE,  APPEARANCEUR, [place], [place], [place], [place], [place], [place], PROTEINUR, UROBILINOGEN, NITRITE, LEUKOCYTESUR Sepsis Labs Invalid input(s): PROCALCITONIN,  WBC,  LACTICIDVEN Microbiology Recent Results (from the past 240 hour(s))  Respiratory Panel by RT PCR (Flu A&B, Covid) - Nasopharyngeal Swab     Status: None   Collection Time: 06/11/20 12:25 AM   Specimen: Nasopharyngeal Swab  Result Value Ref Range Status   SARS Coronavirus 2 by RT PCR NEGATIVE NEGATIVE Final    Comment: (NOTE) SARS-CoV-2 target nucleic acids are NOT DETECTED.  The SARS-CoV-2 RNA is generally detectable in upper respiratoy specimens during the acute phase of infection. The lowest concentration of SARS-CoV-2 viral copies this assay can detect is 131 copies/mL. A negative result does not preclude SARS-Cov-2 infection and should not be used as the sole basis for treatment or other patient management decisions. A negative result may occur with  improper specimen collection/handling, submission of specimen other than nasopharyngeal swab, presence of viral mutation(s) within the areas targeted by this assay, and inadequate number of viral copies (<131 copies/mL). A negative result must be combined with clinical observations, patient history, and epidemiological information. The expected result is Negative.  Fact Sheet for Patients:  PinkCheek.be  Fact Sheet for Healthcare Providers:  GravelBags.it  This test is no t yet approved or cleared by the [place] FDA and  has been authorized for detection and/or diagnosis of SARS-CoV-2 by FDA under an Emergency Use Authorization (EUA). This EUA will remain  in effect (meaning this test can be used) for the duration of the COVID-19 declaration under Section 564(b)(1) of the Act, 21 U.S.C. section 360bbb-3(b)(1), unless the authorization is terminated or revoked sooner.     Influenza A by PCR  NEGATIVE NEGATIVE Final   Influenza B by PCR NEGATIVE NEGATIVE Final    Comment: (NOTE) The Xpert Xpress SARS-CoV-2/FLU/RSV assay is intended as an aid in  the diagnosis of influenza from Nasopharyngeal swab specimens and  should not be used as a sole basis for treatment. Nasal washings and  aspirates are unacceptable for Xpert Xpress SARS-CoV-2/FLU/RSV  testing.  Fact Sheet for Patients: PinkCheek.be  Fact Sheet for Healthcare Providers: GravelBags.it  This test is not yet approved or cleared by the [place] FDA and  has been authorized for detection and/or diagnosis of SARS-CoV-2 by  FDA under an Emergency Use Authorization (EUA). This EUA will remain  in effect (meaning this test can be used) for the duration of the  Covid-19 declaration under Section 564(b)(1) of the Act, 21  U.S.C. section 360bbb-3(b)(1), unless the authorization is  terminated or revoked. Performed at [facility], [address], [place], [place] [address]      Time coordinating discharge: Over 30 minutes  SIGNED:  Shawna Clamp, MD  [facility] 06/11/2020, 3:12 PM   If 7PM-7AM, please contact night-coverage [email or web address]

## 2020-06-11 NOTE — Progress Notes (Signed)
Progress Note  Patient Name: Edward Armstrong. Date of Encounter: 06/11/2020  Primary Cardiologist: Dorris Carnes, MD  Subjective   No significant palpitations or chest pain under observation.  Still in the ER.  Inpatient Medications    Scheduled Meds: . aspirin EC  81 mg Oral Daily  . enoxaparin (LOVENOX) injection  40 mg Subcutaneous Q24H  . ezetimibe  10 mg Oral Daily  . multivitamin with minerals  1 tablet Oral Daily  . pantoprazole  40 mg Oral Daily   Continuous Infusions: . sodium chloride    . sodium chloride    . diltiazem (CARDIZEM) infusion Stopped (06/11/20 0021)   PRN Meds: sodium chloride, acetaminophen **OR** acetaminophen, ALPRAZolam, ondansetron **OR** ondansetron (ZOFRAN) IV, zolpidem   Vital Signs    Vitals:   06/10/20 2304 06/11/20 0019 06/11/20 0500 06/11/20 0600  BP:  111/71 97/66 (!) 109/53  Pulse:  (!) 54 (!) 52 62  Resp:  15 15 15   Temp:      TempSrc:      SpO2: 95% 97% 96% 95%  Weight:      Height:        Intake/Output Summary (Last 24 hours) at 06/11/2020 1420 Last data filed at 06/10/2020 1733 Gross per 24 hour  Intake --  Output 950 ml  Net -950 ml   Filed Weights   06/10/20 1243  Weight: 88 kg    Telemetry    Sinus rhythm.  Personally reviewed.  Physical Exam   GEN: No acute distress.   Neck: No JVD. Cardiac: RRR, no murmur, rub, or gallop.  Respiratory: Nonlabored. Clear to auscultation bilaterally. GI: Soft, nontender, bowel sounds present. MS: No edema; No deformity. Neuro:  Nonfocal. Psych: Alert and oriented x 3. Normal affect.  Labs    Chemistry Recent Labs  Lab 06/10/20 1335  NA 140  K 4.1  CL 103  CO2 26  GLUCOSE 111*  BUN 15  CREATININE 1.01  CALCIUM 9.6  GFRNONAA >60  ANIONGAP 11     Hematology Recent Labs  Lab 06/10/20 1335  WBC 6.9  RBC 5.16  HGB 16.0  HCT 47.6  MCV 92.2  MCH 31.0  MCHC 33.6  RDW 12.2  PLT 292    Cardiac Enzymes Recent Labs  Lab 06/10/20 1335  06/10/20 1533  TROPONINIHS 2 3    Radiology    DG Chest 2 View  Result Date: 06/10/2020 CLINICAL DATA:  Chest pain EXAM: CHEST - 2 VIEW COMPARISON:  2015 FINDINGS: The heart size and mediastinal contours are within normal limits. Both lungs are clear. No pleural effusion or pneumothorax. The visualized skeletal structures are unremarkable. IMPRESSION: No acute process in the chest. Electronically Signed   By: Macy Mis M.D.   On: 06/10/2020 13:11   ECHOCARDIOGRAM COMPLETE  Result Date: 06/11/2020    ECHOCARDIOGRAM REPORT   Patient Name:   Edward Armstrong. Date of Exam: 06/11/2020 Medical Rec #:  [id number]         Height:       71.0 in Accession #:    [id number]        Weight:       194.0 lb Date of Birth:  1951/01/27         BSA:          2.081 m Patient Age:    69 years          BP:           109/53  mmHg Patient Gender: M                 HR:           62 bpm. Exam Location:  [facility] Procedure: 2D Echo Indications:    Abnormal ECG 794.31 / R94.31  History:        Patient has no prior history of Echocardiogram examinations.                 Risk Factors:Hypertension, Dyslipidemia, Diabetes and                 Non-Smoker. Chest Pressure, Left Shoulder Pain, SVT.  Sonographer:    Leavy Cella RDCS (AE) Referring Phys: [id number] PAULA V ROSS IMPRESSIONS  1. Left ventricular ejection fraction, by estimation, is 55 to 60%. The left ventricle has normal function. The left ventricle has no regional wall motion abnormalities. There is mild left ventricular hypertrophy. Left ventricular diastolic parameters are indeterminate.  2. Right ventricular systolic function is normal. The right ventricular size is normal. Tricuspid regurgitation signal is inadequate for assessing PA pressure.  3. The mitral valve is grossly normal. Trivial mitral valve regurgitation.  4. The aortic valve is tricuspid. Aortic valve regurgitation is not visualized.  5. The inferior vena cava is normal in size with greater than 50%  respiratory variability, suggesting right atrial pressure of 3 mmHg. FINDINGS  Left Ventricle: Left ventricular ejection fraction, by estimation, is 55 to 60%. The left ventricle has normal function. The left ventricle has no regional wall motion abnormalities. The left ventricular internal cavity size was normal in size. There is  mild left ventricular hypertrophy. Left ventricular diastolic parameters are indeterminate. Right Ventricle: The right ventricular size is normal. No increase in right ventricular wall thickness. Right ventricular systolic function is normal. Tricuspid regurgitation signal is inadequate for assessing PA pressure. Left Atrium: Left atrial size was normal in size. Right Atrium: Right atrial size was normal in size. Pericardium: There is no evidence of pericardial effusion. Mitral Valve: The mitral valve is grossly normal. Mild mitral annular calcification. Trivial mitral valve regurgitation. Tricuspid Valve: The tricuspid valve is grossly normal. Tricuspid valve regurgitation is trivial. Aortic Valve: The aortic valve is tricuspid. There is mild aortic valve annular calcification. Aortic valve regurgitation is not visualized. Pulmonic Valve: The pulmonic valve was grossly normal. Pulmonic valve regurgitation is trivial. Aorta: The aortic root is normal in size and structure. Venous: The inferior vena cava is normal in size with greater than 50% respiratory variability, suggesting right atrial pressure of 3 mmHg. IAS/Shunts: No atrial level shunt detected by color flow Doppler.  LEFT VENTRICLE PLAX 2D LVIDd:         4.11 cm  Diastology LVIDs:         2.83 cm  LV e' medial:    6.09 cm/s LV PW:         1.17 cm  LV E/e' medial:  9.6 LV IVS:        1.11 cm  LV e' lateral:   10.80 cm/s LVOT diam:     1.80 cm  LV E/e' lateral: 5.4 LVOT Area:     2.54 cm  RIGHT VENTRICLE RV S prime:     10.10 cm/s TAPSE (M-mode): 2.2 cm LEFT ATRIUM             Index       RIGHT ATRIUM           Index LA diam:  3.90 cm 1.87 cm/m  RA Area:     16.60 cm LA Vol (A2C):   39.3 ml 18.88 ml/m RA Volume:   43.00 ml  20.66 ml/m LA Vol (A4C):   30.0 ml 14.41 ml/m LA Biplane Vol: 35.9 ml 17.25 ml/m   AORTA Ao Root diam: 3.10 cm MITRAL VALVE MV Area (PHT): 3.77 cm    SHUNTS MV Decel Time: 201 msec    Systemic Diam: 1.80 cm MV E velocity: 58.70 cm/s MV A velocity: 67.70 cm/s MV E/A ratio:  0.87 Rozann Lesches MD Electronically signed by Rozann Lesches MD Signature Date/Time: 06/11/2020/1:57:07 PM    Final     Patient Profile     69 y.o. male with a history of hypertension, type 2 diabetes mellitus and hyperlipidemia now presenting with symptomatic SVT.  Assessment & Plan    1.  PSVT, recently documented although with history of intermittent palpitations.  Zio patch just recently sent back in for review with results pending.  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  He is clinically stable today, taken off IV diltiazem around midnight.  LVEF is normal at 55 to 60%.  No major valvular abnormalities.  TSH normal.  High-sensitivity troponin I levels normal.  2.  Essential hypertension, on enalapril 10 mg daily as an outpatient.  Discussed situation with patient and wife at bedside.  He is stable for discharge home.  Would recommend starting Cardizem CD 120 mg daily, hold enalapril for now with blood pressure checks at home.  This can be resumed perhaps even a lower dose depending on blood pressure trend.  We will schedule outpatient follow-up in the [facility] office for reevaluation of PSVT control and whether or not he would benefit from EP consultation.  Signed, Rozann Lesches, MD  06/11/2020, 2:20 PM

## 2020-06-11 NOTE — Progress Notes (Signed)
*  PRELIMINARY RESULTS* Echocardiogram 2D Echocardiogram has been performed.  Edward Armstrong 06/11/2020, 10:18 AM

## 2020-06-13 DIAGNOSIS — E1159 Type 2 diabetes mellitus with other circulatory complications: Secondary | ICD-10-CM | POA: Diagnosis not present

## 2020-06-19 DIAGNOSIS — R002 Palpitations: Secondary | ICD-10-CM | POA: Diagnosis not present

## 2020-06-20 ENCOUNTER — Other Ambulatory Visit: Payer: Self-pay

## 2020-06-20 DIAGNOSIS — E1169 Type 2 diabetes mellitus with other specified complication: Secondary | ICD-10-CM | POA: Diagnosis not present

## 2020-06-20 DIAGNOSIS — I471 Supraventricular tachycardia: Secondary | ICD-10-CM | POA: Diagnosis not present

## 2020-06-20 DIAGNOSIS — R7309 Other abnormal glucose: Secondary | ICD-10-CM | POA: Diagnosis not present

## 2020-06-20 DIAGNOSIS — R002 Palpitations: Secondary | ICD-10-CM

## 2020-06-20 DIAGNOSIS — Z23 Encounter for immunization: Secondary | ICD-10-CM | POA: Diagnosis not present

## 2020-06-25 ENCOUNTER — Ambulatory Visit (INDEPENDENT_AMBULATORY_CARE_PROVIDER_SITE_OTHER): Payer: Medicare Other | Admitting: Cardiology

## 2020-06-25 ENCOUNTER — Other Ambulatory Visit: Payer: Self-pay

## 2020-06-25 ENCOUNTER — Encounter: Payer: Self-pay | Admitting: Cardiology

## 2020-06-25 DIAGNOSIS — E785 Hyperlipidemia, unspecified: Secondary | ICD-10-CM

## 2020-06-25 DIAGNOSIS — I10 Essential (primary) hypertension: Secondary | ICD-10-CM | POA: Diagnosis not present

## 2020-06-25 DIAGNOSIS — I471 Supraventricular tachycardia: Secondary | ICD-10-CM

## 2020-06-25 DIAGNOSIS — E119 Type 2 diabetes mellitus without complications: Secondary | ICD-10-CM | POA: Diagnosis not present

## 2020-06-25 NOTE — Assessment & Plan Note (Signed)
Admitted through the ED 06/10/2020 with chest pain and found to be in SVT- 173.  Converted spontaneously. Echo- normal LVF, normal LA TSH WNL

## 2020-06-25 NOTE — Assessment & Plan Note (Signed)
Mild LVH on echo

## 2020-06-25 NOTE — Patient Instructions (Signed)
Medication Instructions:  Your physician recommends that you continue on your current medications as directed. Please refer to the Current Medication list given to you today.  *If you need a refill on your cardiac medications before your next appointment, please call your pharmacy*   Lab Work: NONE   If you have labs (blood work) drawn today and your tests are completely normal, you will receive your results only by: Marland Kitchen MyChart Message (if you have MyChart) OR . A paper copy in the mail If you have any lab test that is abnormal or we need to change your treatment, we will call you to review the results.   Testing/Procedures: You have been referred to Electrophysiology     Follow-Up: At [facility], you and your health needs are our priority.  As part of our continuing mission to provide you with exceptional heart care, we have created designated Provider Care Teams.  These Care Teams include your primary Cardiologist (physician) and Advanced Practice Providers (APPs -  Physician Assistants and Nurse Practitioners) who all work together to provide you with the care you need, when you need it.  We recommend signing up for the patient portal called "MyChart".  Sign up information is provided on this After Visit Summary.  MyChart is used to connect with patients for Virtual Visits (Telemedicine).  Patients are able to view lab/test results, encounter notes, upcoming appointments, etc.  Non-urgent messages can be sent to your provider as well.   To learn more about what you can do with MyChart, go to [email or web address].    Your next appointment:   3 month(s)  The format for your next appointment:   In Person  Provider:   Rozann Lesches, MD   Other Instructions Thank you for choosing [facility]!

## 2020-06-25 NOTE — Assessment & Plan Note (Signed)
On Glucophage 

## 2020-06-25 NOTE — Assessment & Plan Note (Signed)
H/O Lipitor intolerance- currently on Zetia- PCP following

## 2020-06-25 NOTE — Progress Notes (Signed)
Cardiology Office Note:    Date:  06/25/2020   ID:  Edward Gamma., DOB 1951-05-31, MRN [id number]  PCP:  Asencion Noble, MD  Cardiologist:  Dr Domenic Polite Electrophysiologist:  None   Referring MD: Asencion Noble, MD   No chief complaint on file. F/U from ED visit 06/10/2020  History of Present Illness:    Edward Almquist. is a 69 y.o. male with a hx of hypertension, non-insulin-dependent diabetes, and palpitations.  He had noted some intermittent skipped beats several weeks ago.  He actually went to the emergency room for this at one point, he waited about 3 hours and then left.  He reportedly had a single PAC on his EKG then.  A monitor was placed as an outpatient 10/7 to 06/09/2020 after speaking with his primary care provider.  This showed sinus rhythm with only rare ectopy.  On 06/10/2020 he developed rapid tachycardia with chest pain.  In the emergency room he had documented SVT with a rate of 173.  He broke spontaneously.  He was placed on IV diltiazem.  Echocardiogram showed normal LV function with mild LVH and normal LA size.  Electrolytes were normal, his TSH was normal.  He was taken off his ACE inhibitor and placed on diltiazem.    He presents to the office today for follow-up.  Since discharge he has generally done well although he did notice some tachycardia that was brief while playing pickle ball a couple days ago.  He has had intermittent palpitations although they are short-lived.  He denies any over-the-counter decongestant use.  He does drink 2 cups of coffee a day and I advised him to cut this back.  Past Medical History:  Diagnosis Date  . Allergy   . Anxiety   . Depression   . Diabetes mellitus without complication ([place])   . GERD (gastroesophageal reflux disease)   . Hyperlipidemia   . Hypertension   . Pre-diabetes     Past Surgical History:  Procedure Laterality Date  . APPENDECTOMY    . VASECTOMY      Current Medications: Current Meds  Medication Sig  .  ALPRAZolam (XANAX) 0.25 MG tablet Take 0.25 mg by mouth in the morning and at bedtime.   Marland Kitchen aspirin EC 81 MG tablet Take 81 mg by mouth daily.  Marland Kitchen diltiazem (CARDIZEM CD) 120 MG 24 hr capsule Take 1 capsule (120 mg total) by mouth daily.  Marland Kitchen ezetimibe (ZETIA) 10 MG tablet Take 10 mg by mouth daily.  . metFORMIN (GLUCOPHAGE) 500 MG tablet Take 1,000 mg by mouth 2 (two) times daily with a meal.   . Multiple Vitamins-Minerals (VITAMIN C EFFERVESCENT BLEND PO) Take 1 packet by mouth daily.  Marland Kitchen omeprazole (PRILOSEC) 40 MG capsule Take 40 mg by mouth daily.  . [DISCONTINUED] esomeprazole (NEXIUM) 40 MG capsule Take 40 mg by mouth daily at 12 noon.  . [DISCONTINUED] Multiple Vitamin (MULTIVITAMIN) tablet Take 1 tablet by mouth daily.     Allergies:   Macrodantin [nitrofurantoin macrocrystal], Clindamycin/lincomycin, and Septra [sulfamethoxazole-trimethoprim]   Social History   Socioeconomic History  . Marital status: Married    Spouse name: Not on file  . Number of children: Not on file  . Years of education: Not on file  . Highest education level: Not on file  Occupational History  . Not on file  Tobacco Use  . Smoking status: Never Smoker  . Smokeless tobacco: Never Used  Vaping Use  . Vaping Use: Never used  Substance and Sexual Activity  . Alcohol use: Yes    Comment: Moderately  . Drug use: No  . Sexual activity: Not on file  Other Topics Concern  . Not on file  Social History Narrative  . Not on file   Social Determinants of Health   Financial Resource Strain:   . Difficulty of Paying Living Expenses: Not on file  Food Insecurity:   . Worried About Charity fundraiser in the Last Year: Not on file  . Ran Out of Food in the Last Year: Not on file  Transportation Needs:   . Lack of Transportation (Medical): Not on file  . Lack of Transportation (Non-Medical): Not on file  Physical Activity:   . Days of Exercise per Week: Not on file  . Minutes of Exercise per Session: Not  on file  Stress:   . Feeling of Stress : Not on file  Social Connections:   . Frequency of Communication with Friends and Family: Not on file  . Frequency of Social Gatherings with Friends and Family: Not on file  . Attends Religious Services: Not on file  . Active Member of Clubs or Organizations: Not on file  . Attends Archivist Meetings: Not on file  . Marital Status: Not on file     Family History: The patient's family history includes Diabetes in his mother; Heart attack in his father. There is no history of Colon cancer, Esophageal cancer, Liver cancer, Pancreatic cancer, Rectal cancer, or Stomach cancer.  ROS:   Please see the history of present illness.     All other systems reviewed and are negative.  EKGs/Labs/Other Studies Reviewed:    The following studies were reviewed today: Echo 06/10/2020- IMPRESSIONS    1. Left ventricular ejection fraction, by estimation, is 55 to 60%. The  left ventricle has normal function. The left ventricle has no regional  wall motion abnormalities. There is mild left ventricular hypertrophy.  Left ventricular diastolic parameters  are indeterminate.  2. Right ventricular systolic function is normal. The right ventricular  size is normal. Tricuspid regurgitation signal is inadequate for assessing  PA pressure.  3. The mitral valve is grossly normal. Trivial mitral valve  regurgitation.  4. The aortic valve is tricuspid. Aortic valve regurgitation is not  visualized.  5. The inferior vena cava is normal in size with greater than 50%  respiratory variability, suggesting right atrial pressure of 3 mmHg.   EKG:  EKG is not ordered today.  The ekg ordered 06/11/2020 demonstrates NSR- HR 92, LVH, TWI 3- AVF  Recent Labs: 06/10/2020: BUN 15; Creatinine, Ser 1.01; Hemoglobin 16.0; Magnesium 1.9; Platelets 292; Potassium 4.1; Sodium 140; TSH 1.701  Recent Lipid Panel No results found for: CHOL, TRIG, HDL, CHOLHDL, VLDL,  LDLCALC, LDLDIRECT  Physical Exam:    VS:  BP 136/78   Pulse 64   Ht 5\' 11"  (1.803 m)   Wt 202 lb (91.6 kg)   SpO2 99%   BMI 28.17 kg/m     Wt Readings from Last 3 Encounters:  06/25/20 202 lb (91.6 kg)  06/10/20 194 lb (88 kg)  05/31/20 194 lb (88 kg)     GEN: Well nourished, well developed in no acute distress HEENT: Normal NECK: No JVD; No carotid bruits CARDIAC: RRR, no murmurs, rubs, gallops RESPIRATORY:  Clear to auscultation without rales, wheezing or rhonchi  ABDOMEN: Soft, non-tender, non-distended MUSCULOSKELETAL:  No edema; No deformity  SKIN: Warm and dry NEUROLOGIC:  Alert and  oriented x 3 PSYCHIATRIC:  Normal affect   ASSESSMENT:    SVT (supraventricular tachycardia) ([place]) Admitted through the ED 06/10/2020 with chest pain and found to be in SVT- 173.  Converted spontaneously. Echo- normal LVF, normal LA TSH WNL  Essential hypertension Mild LVH on echo  Diabetes mellitus type 2, noninsulin dependent (HCC) On Glucophage  Hyperlipidemia H/O Lipitor intolerance- currently on Zetia- PCP following  PLAN:    I offered Edward Armstrong continued medical Rx and observation vs EP evaluation for consideration of PSVT ablation.  They would prefer to have the EP consult which I think appropriate in light of his continued symptoms.     Medication Adjustments/Labs and Tests Ordered: Current medicines are reviewed at length with the patient today.  Concerns regarding medicines are outlined above.  No orders of the defined types were placed in this encounter.  No orders of the defined types were placed in this encounter.   There are no Patient Instructions on file for this visit.   Angelena Form, PA-C  06/25/2020 10:25 AM    [facility]

## 2020-07-02 ENCOUNTER — Encounter: Payer: Self-pay | Admitting: Dermatology

## 2020-07-02 ENCOUNTER — Other Ambulatory Visit: Payer: Self-pay

## 2020-07-02 ENCOUNTER — Ambulatory Visit (INDEPENDENT_AMBULATORY_CARE_PROVIDER_SITE_OTHER): Payer: Medicare Other | Admitting: Dermatology

## 2020-07-02 DIAGNOSIS — L570 Actinic keratosis: Secondary | ICD-10-CM

## 2020-07-02 DIAGNOSIS — L821 Other seborrheic keratosis: Secondary | ICD-10-CM | POA: Diagnosis not present

## 2020-07-02 DIAGNOSIS — Z1283 Encounter for screening for malignant neoplasm of skin: Secondary | ICD-10-CM | POA: Diagnosis not present

## 2020-07-02 DIAGNOSIS — D485 Neoplasm of uncertain behavior of skin: Secondary | ICD-10-CM | POA: Diagnosis not present

## 2020-07-02 NOTE — Patient Instructions (Signed)

## 2020-07-04 ENCOUNTER — Ambulatory Visit: Payer: Medicare Other | Attending: Internal Medicine

## 2020-07-04 ENCOUNTER — Other Ambulatory Visit: Payer: Self-pay

## 2020-07-04 ENCOUNTER — Ambulatory Visit (INDEPENDENT_AMBULATORY_CARE_PROVIDER_SITE_OTHER): Payer: Medicare Other | Admitting: Cardiology

## 2020-07-04 ENCOUNTER — Encounter: Payer: Self-pay | Admitting: Cardiology

## 2020-07-04 VITALS — BP 120/72 | HR 69 | Ht 71.0 in | Wt 200.2 lb

## 2020-07-04 DIAGNOSIS — I471 Supraventricular tachycardia: Secondary | ICD-10-CM

## 2020-07-04 DIAGNOSIS — Z23 Encounter for immunization: Secondary | ICD-10-CM

## 2020-07-04 MED ORDER — DILTIAZEM HCL ER COATED BEADS 180 MG PO CP24: 180.0000 mg | ORAL_CAPSULE | Freq: Every day | ORAL | 3 refills | Status: DC

## 2020-07-04 NOTE — H&P (View-Only) (Signed)
Electrophysiology Office Note   Date:  07/04/2020   ID:  Srikar Chiang., DOB [birth year], MRN [id number]  PCP:  Asencion Noble, MD  Cardiologist:  Domenic Polite Primary Electrophysiologist:  Alyha Marines Meredith Leeds, MD    Chief Complaint: SVT   History of Present Illness: Edward Armstrong. is a 69 y.o. male who is being seen today for the evaluation of SVT at the request of Erlene Quan, [place]. Presenting today for electrophysiology evaluation.  He has a history of hypertension, non-insulin-dependent diabetes, and palpitations.  He went to the emergency room at one point, but left after 3 hours.  He wore a cardiac monitor that showed sinus rhythm with rare ectopy.  On 06/10/2020 he developed rapid tachycardia with chest pain.  In the emergency room he was documented to have SVT with a heart rate of 173 bpm which terminated spontaneously.  He was started on diltiazem.  Since that time, he has generally done well.  He did notice one further episode of tachycardia while playing pickle ball.  He has intermittent palpitations though they are short-lived.  Today, he denies symptoms of palpitations, chest pain, shortness of breath, orthopnea, PND, lower extremity edema, claudication, dizziness, presyncope, syncope, bleeding, or neurologic sequela. The patient is tolerating medications without difficulties.    Past Medical History:  Diagnosis Date  . Allergy   . Anxiety   . Depression   . Diabetes mellitus without complication ([place])   . GERD (gastroesophageal reflux disease)   . Hyperlipidemia   . Hypertension   . Pre-diabetes    Past Surgical History:  Procedure Laterality Date  . APPENDECTOMY    . VASECTOMY       Current Outpatient Medications  Medication Sig Dispense Refill  . ALPRAZolam (XANAX) 0.25 MG tablet Take 0.25 mg by mouth in the morning and at bedtime.     Marland Kitchen aspirin EC 81 MG tablet Take 81 mg by mouth daily.    Marland Kitchen diltiazem (CARDIZEM CD) 120 MG 24 hr capsule Take 1 capsule  (120 mg total) by mouth daily. 30 capsule 1  . ezetimibe (ZETIA) 10 MG tablet Take 10 mg by mouth daily.    . metFORMIN (GLUCOPHAGE) 500 MG tablet Take 1,000 mg by mouth 2 (two) times daily with a meal.     . Multiple Vitamins-Minerals (VITAMIN C EFFERVESCENT BLEND PO) Take 1 packet by mouth daily.    Marland Kitchen omeprazole (PRILOSEC) 40 MG capsule Take 40 mg by mouth daily.     No current facility-administered medications for this visit.    Allergies:   Macrodantin [nitrofurantoin macrocrystal], Clindamycin/lincomycin, and Septra [sulfamethoxazole-trimethoprim]   Social History:  The patient  reports that he has never smoked. He has never used smokeless tobacco. He reports current alcohol use. He reports that he does not use drugs.   Family History:  The patient's family history includes Diabetes in his mother; Heart attack in his father.    ROS:  Please see the history of present illness.   Otherwise, review of systems is positive for none.   All other systems are reviewed and negative.    PHYSICAL EXAM: VS:  There were no vitals taken for this visit. , BMI There is no height or weight on file to calculate BMI. GEN: Well nourished, well developed, in no acute distress  HEENT: normal  Neck: no JVD, carotid bruits, or masses Cardiac: RRR; no murmurs, rubs, or gallops,no edema  Respiratory:  clear to auscultation bilaterally, normal work of breathing  GI: soft, nontender, nondistended, + BS MS: no deformity or atrophy  Skin: warm and dry Neuro:  Strength and sensation are intact Psych: euthymic mood, full affect  EKG:  EKG is ordered today. Personal review of the ekg ordered shows sinus rhythm, rate 65  Recent Labs: 06/10/2020: BUN 15; Creatinine, Ser 1.01; Hemoglobin 16.0; Magnesium 1.9; Platelets 292; Potassium 4.1; Sodium 140; TSH 1.701    Lipid Panel  No results found for: CHOL, TRIG, HDL, CHOLHDL, VLDL, LDLCALC, LDLDIRECT   Wt Readings from Last 3 Encounters:  06/25/20 202 lb  (91.6 kg)  06/10/20 194 lb (88 kg)  05/31/20 194 lb (88 kg)      Other studies Reviewed: Additional studies/ records that were reviewed today include: TTE 06/11/20  Review of the above records today demonstrates:  1. Left ventricular ejection fraction, by estimation, is 55 to 60%. The  left ventricle has normal function. The left ventricle has no regional  wall motion abnormalities. There is mild left ventricular hypertrophy.  Left ventricular diastolic parameters  are indeterminate.  2. Right ventricular systolic function is normal. The right ventricular  size is normal. Tricuspid regurgitation signal is inadequate for assessing  PA pressure.  3. The mitral valve is grossly normal. Trivial mitral valve  regurgitation.  4. The aortic valve is tricuspid. Aortic valve regurgitation is not  visualized.  5. The inferior vena cava is normal in size with greater than 50%  respiratory variability, suggesting right atrial pressure of 3 mmHg.   ASSESSMENT AND PLAN:  1.  SVT: Initially diagnosed on 06/10/2020 after an emergency room visit.  Is currently on diltiazem.  I have offered ablation versus continued diltiazem.  Risks and benefits of ablation were discussed include bleeding, tamponade, heart block, stroke, damage to other organs.  At this point, he would like to try medication management.  We Tuere N[place] increase diltiazem to 180 mg.  2.  Hypertension: Currently well controlled  3.  Hyperlipidemia: Continue Zetia per PCP  Case discussed with primary cardiology  Current medicines are reviewed at length with the patient today.   The patient does not have concerns regarding his medicines.  The following changes were made today: Increase diltiazem  Labs/ tests ordered today include:  No orders of the defined types were placed in this encounter.    Disposition:   FU with [employer] 3 months  Signed, Neka Bise Meredith Leeds, MD  07/04/2020 9:04 AM     [facility] [address] [place] [address] [phone] (office) [phone] (fax)

## 2020-07-04 NOTE — Patient Instructions (Addendum)
Medication Instructions:  Your physician has recommended you make the following change in your medication:  1. INCREASE Diltiazem to 180 mg once daily   *If you need a refill on your cardiac medications before your next appointment, please call your pharmacy*   Lab Work: None ordered If you have labs (blood work) drawn today and your tests are completely normal, you will receive your results only by: Marland Kitchen MyChart Message (if you have MyChart) OR . A paper copy in the mail If you have any lab test that is abnormal or we need to change your treatment, we will call you to review the results.   Testing/Procedures: Your physician has recommended that you have an ablation. Catheter ablation is a medical procedure used to treat some cardiac arrhythmias (irregular heartbeats). During catheter ablation, a long, thin, flexible tube is put into a blood vessel in your groin (upper thigh), or neck. This tube is called an ablation catheter. It is then guided to your heart through the blood vessel. Radio frequency waves destroy small areas of heart tissue where abnormal heartbeats may cause an arrhythmia to start. Please see the instructions below located under "other instructions".  Please call the office if you would to proceed with scheduling this  procedure.   Follow-Up: At [facility], you and your health needs are our priority.  As part of our continuing mission to provide you with exceptional heart care, we have created designated Provider Care Teams.  These Care Teams include your primary Cardiologist (physician) and Advanced Practice Providers (APPs -  Physician Assistants and Nurse Practitioners) who all work together to provide you with the care you need, when you need it.  We recommend signing up for the patient portal called "MyChart".  Sign up information is provided on this After Visit Summary.  MyChart is used to connect with patients for Virtual Visits (Telemedicine).  Patients are able to  view lab/test results, encounter notes, upcoming appointments, etc.  Non-urgent messages can be sent to your provider as well.   To learn more about what you can do with MyChart, go to [email or web address].    Your next appointment:   3 month(s)  The format for your next appointment:   In Person  Provider:   Allegra Lai, MD    Thank you for choosing [facility]!!   Trinidad Curet, RN [phone]   Other Instructions   Supraventricular Tachycardia, Adult Supraventricular tachycardia (SVT) is a type of abnormal heart rhythm. It causes the heart to beat very quickly and then return to a normal speed. A normal resting heart rate is 60-100 beats per minute. During an episode of SVT, your heart rate may be higher than 150 beats per minute. Episodes of SVT can be frightening, but they are usually not dangerous. However, if episodes happen several times per day or last longer than a few seconds, they may lead to heart failure. What are the causes?  Usually, a normal heartbeat starts when an area called the sinoatrial node releases an electrical signal. In SVT, other areas of the heart send out electrical signals that interfere with the signal from the sinoatrial node. It is not known why some people get SVT and others do not. What increases the risk? You are more likely to develop this condition if you are:  28-40 years old.  A woman. The following factors may make you more likely to develop this condition:  Stress.  Tiredness.  Smoking.  Stimulant drugs, such as cocaine and  methamphetamine.  Alcohol.  Caffeine.  Pregnancy.  Anxiety. What are the signs or symptoms? Symptoms of this condition include:  A pounding heart.  A feeling that the heart is skipping beats (palpitations).  Weakness.  Shortness of breath.  Tightness or pain in your chest.  Light-headedness.  Anxiety.  Dizziness.  Sweating.  Nausea.  Fainting.  Fatigue or tiredness. A  mild episode may not cause symptoms. How is this diagnosed? This condition may be diagnosed based on:  Your symptoms.  A physical exam. ? If you have an episode of SVT during the exam, the health care provider may be able to diagnose SVT by listening to your heart and feeling your pulse.  Tests. These may include: ? An electrocardiogram (ECG). This test is done to check for problems with electrical activity in the heart. ? A Holter monitor or event monitor test. This test involves wearing a portable device that monitors your heart rate over time. ? An echocardiogram. This test involves taking an image of your heart using sound waves. It is done to rule out other causes of a fast heart rate. ? Blood tests. How is this treated? This condition may be treated with:  Vagal nerve stimulation. The treatment involves stimulating your vagus nerve, which slows down the heart. It is often the first and only treatment that is needed for this condition. Work with your health care provider to find which one works best for you. Ways to do this treatment include: ? Holding your breath and pushing, as though you are having a bowel movement. ? Massaging an area on one side of your neck, below your jaw. Do not try this yourself. Only a health care provider should do this. If done the wrong way, it can lead to a stroke. ? Bending forward with your head between your legs. ? Coughing while bending forward with your head between your legs. ? Closing your eyes and massaging your eyeballs. A health care provider should guide you through this method before you try it on your own.  Medicines that prevent attacks.  Medicine to stop an attack. The medicine is given through an IV at the hospital.  A small electric shock (cardioversion) that stops an attack. Before you get the shock, you will get medicine to make you fall asleep.  Radiofrequency ablation. In this procedure, a small, thin tube (catheter) is used to  send radiofrequency energy to the area of tissue that is causing the rapid heartbeats. The energy kills the cells and helps your heart keep a normal rhythm. You may have this treatment if you have symptoms of SVT often. If you do not have symptoms, you may not need treatment. Follow these instructions at home: Stress  Avoid stressful situations when possible.  Find healthy ways of managing stress that work for you. Some healthy ways to manage stress include: ? Taking part in relaxing activities, such as yoga, meditation, or being out in nature. ? Listening to relaxing music. ? Practicing relaxation techniques, such as deep breathing. ? Leading a healthy lifestyle. This involves getting plenty of sleep, exercising, and eating a balanced diet. ? Attending counseling or talk therapy with a mental health professional. Lifestyle   Try to get at least 7 hours of sleep each night.  Do not use any products that contain nicotine or tobacco, such as cigarettes, e-cigarettes, and chewing tobacco. If you need help quitting, ask your health care provider.  Be aware of how alcohol affects your condition. If alcohol: ?  Triggers episodes of SVT, do not drink alcohol. ? Does not seem to trigger episodes, limit alcohol intake to no more than 1 drink a day for nonpregnant women and 2 drinks a day for men. Be aware of how much alcohol is in your drink. In the [place] one drink equals one 12 oz bottle of beer (355 mL), one 5 oz glass of wine (148 mL), or one 1 oz glass of hard liquor (44 mL).  Be aware of how caffeine affects your condition. If caffeine: ? Triggers episodes of SVT, do not eat, drink, or use anything with caffeine in it. ? Does not seem to trigger episodes, consume caffeine in moderation.  Do not use stimulant drugs. If you need help quitting, talk with your health care provider. General instructions  Maintain a healthy weight.  Exercise regularly. Ask your health care provider to suggest  some good activities for you. Aim for one or a combination of the following: ? 150 minutes per week of moderate exercise, such as walking or yoga. ? 75 minutes per week of vigorous exercise, such as running or swimming.  Perform vagus nerve stimulation as directed by your health care provider.  Take over-the-counter and prescription medicines only as told by your health care provider.  Keep all follow-up visits as told by your health care provider. This is important. Contact a health care provider if:  You have episodes of SVT more often than before.  Episodes of SVT last longer than before.  Vagus nerve stimulation is no longer helping.  You have new symptoms. Get help right away if:  You have chest pain.  Your symptoms get worse.  You have trouble breathing.  You have an episode of SVT that lasts longer than 20 minutes.  You faint. These symptoms may represent a serious problem that is an emergency. Do not wait to see if the symptoms will go away. Get medical help right away. Call your local emergency services (911 in the [place] Do not drive yourself to the hospital. Summary  Supraventricular tachycardia (SVT) is a type of abnormal heart rhythm.  During an episode of SVT, your heart rate may be higher than 150 beats per minute.  Treatment depends on frequency of occurrence and symptoms experienced. This information is not intended to replace advice given to you by your health care provider. Make sure you discuss any questions you have with your health care provider. Document Revised: 07/05/2018 Document Reviewed: 07/05/2018 Elsevier Patient Education  2020 [place].    Cardiac Ablation Cardiac ablation is a procedure to disable (ablate) a small amount of heart tissue in very specific places. The heart has many electrical connections. Sometimes these connections are abnormal and can cause the heart to beat very fast or irregularly. Ablating some of the problem areas  can improve the heart rhythm or return it to normal. Ablation may be done for people who:  Have Wolff-Parkinson-White syndrome.  Have fast heart rhythms (tachycardia).  Have taken medicines for an abnormal heart rhythm (arrhythmia) that were not effective or caused side effects.  Have a high-risk heartbeat that may be life-threatening. During the procedure, a small incision is made in the neck or the groin, and a long, thin, flexible tube (catheter) is inserted into the incision and moved to the heart. Small devices (electrodes) on the tip of the catheter will send out electrical currents. A type of X-ray (fluoroscopy) will be used to help guide the catheter and to provide images of the heart.  Tell a health care provider about:  Any allergies you have.  All medicines you are taking, including vitamins, herbs, eye drops, creams, and over-the-counter medicines.  Any problems you or family members have had with anesthetic medicines.  Any blood disorders you have.  Any surgeries you have had.  Any medical conditions you have, such as kidney failure.  Whether you are pregnant or may be pregnant. What are the risks? Generally, this is a safe procedure. However, problems may occur, including:  Infection.  Bruising and bleeding at the catheter insertion site.  Bleeding into the chest, especially into the sac that surrounds the heart. This is a serious complication.  Stroke or blood clots.  Damage to other structures or organs.  Allergic reaction to medicines or dyes.  Need for a permanent pacemaker if the normal electrical system is damaged. A pacemaker is a small computer that sends electrical signals to the heart and helps your heart beat normally.  The procedure not being fully effective. This may not be recognized until months later. Repeat ablation procedures are sometimes required. What happens before the procedure?  Follow instructions from your health care provider about  eating or drinking restrictions.  Ask your health care provider about: ? Changing or stopping your regular medicines. This is especially important if you are taking diabetes medicines or blood thinners. ? Taking medicines such as aspirin and ibuprofen. These medicines can thin your blood. Do not take these medicines before your procedure if your health care provider instructs you not to.  Plan to have someone take you home from the hospital or clinic.  If you will be going home right after the procedure, plan to have someone with you for 24 hours. What happens during the procedure?  To lower your risk of infection: ? Your health care team will wash or sanitize their hands. ? Your skin will be washed with soap. ? Hair may be removed from the incision area.  An IV tube will be inserted into one of your veins.  You will be given a medicine to help you relax (sedative).  The skin on your neck or groin will be numbed.  An incision will be made in your neck or your groin.  A needle will be inserted through the incision and into a large vein in your neck or groin.  A catheter will be inserted into the needle and moved to your heart.  Dye may be injected through the catheter to help your surgeon see the area of the heart that needs treatment.  Electrical currents will be sent from the catheter to ablate heart tissue in desired areas. There are three types of energy that may be used to ablate heart tissue: ? Heat (radiofrequency energy). ? Laser energy. ? Extreme cold (cryoablation).  When the necessary tissue has been ablated, the catheter will be removed.  Pressure will be held on the catheter insertion area to prevent excessive bleeding.  A bandage (dressing) will be placed over the catheter insertion area. The procedure may vary among health care providers and hospitals. What happens after the procedure?  Your blood pressure, heart rate, breathing rate, and blood oxygen level  will be monitored until the medicines you were given have worn off.  Your catheter insertion area will be monitored for bleeding. You will need to lie still for a few hours to ensure that you do not bleed from the catheter insertion area.  Do not drive for 24 hours or as long as directed  by your health care provider. Summary  Cardiac ablation is a procedure to disable (ablate) a small amount of heart tissue in very specific places. Ablating some of the problem areas can improve the heart rhythm or return it to normal.  During the procedure, electrical currents will be sent from the catheter to ablate heart tissue in desired areas. This information is not intended to replace advice given to you by your health care provider. Make sure you discuss any questions you have with your health care provider. Document Revised: 02/07/2018 Document Reviewed: 07/06/2016 Elsevier Patient Education  [place].     Electrophysiology/Ablation Procedure Instructions   You are scheduled for a(n)  ablation on __________ with Dr. Allegra Lai.   1.   Pre procedure testing-             A.  LAB WORK --- On __________  for your pre procedure blood work.                 B. COVID TEST-- On __________ @ __________ - This is a Drive Up Visit at [address] [place], [place] [address].  Someone will direct you to the appropriate testing line. Stay in your car and someone will be with you shortly.   After you are tested please go home and self quarantine until the day of your procedure.     2. On the day of your procedure _________ you will go to [facility] hospital [phone] [address]) at _________.  You will go to the main entrance [address] [employer]) and enter where the [employer] parking staff are.  Your driver will drop you off and you will head down the hallway to ADMITTING.  You may have one support person come in to the hospital with you.  They will be asked to wait in the waiting room.   3.   Do not eat or  drink after midnight prior to your procedure.   4.   Do NOT take any medications the morning of your procedure.   5.  Plan for an overnight stay, but you may be discharged home after your procedure.    If you use your phone frequently bring your phone charger, in case you have to stay.  If you are discharged after your procedure you will need someone to drive you home and be with your for 24 hours after your procedure.   6. You will follow up with  Dr. Curt Bears 1 month after your procedure.  This appointment will be made for you.   * If you have ANY questions please call the office [phone] and ask for Lucyann Romano RN or send me a MyChart message   * Occasionally, EP Studies and ablations can become lengthy.  Please make your family aware of this before your procedure starts.  Average time ranges from 2-8 hours for EP studies/ablations.  Your physician will call your family after the procedure with the results.

## 2020-07-04 NOTE — Progress Notes (Signed)
Electrophysiology Office Note   Date:  07/04/2020   ID:  Edward Leonhard., DOB 08/01/1951, MRN [id number]  PCP:  Asencion Noble, MD  Cardiologist:  Domenic Polite Primary Electrophysiologist:  Philippa Vessey Meredith Leeds, MD    Chief Complaint: SVT   History of Present Illness: Edward Hankerson. is a 69 y.o. male who is being seen today for the evaluation of SVT at the request of Erlene Quan, [place]. Presenting today for electrophysiology evaluation.  He has a history of hypertension, non-insulin-dependent diabetes, and palpitations.  He went to the emergency room at one point, but left after 3 hours.  He wore a cardiac monitor that showed sinus rhythm with rare ectopy.  On 06/10/2020 he developed rapid tachycardia with chest pain.  In the emergency room he was documented to have SVT with a heart rate of 173 bpm which terminated spontaneously.  He was started on diltiazem.  Since that time, he has generally done well.  He did notice one further episode of tachycardia while playing pickle ball.  He has intermittent palpitations though they are short-lived.  Today, he denies symptoms of palpitations, chest pain, shortness of breath, orthopnea, PND, lower extremity edema, claudication, dizziness, presyncope, syncope, bleeding, or neurologic sequela. The patient is tolerating medications without difficulties.    Past Medical History:  Diagnosis Date  . Allergy   . Anxiety   . Depression   . Diabetes mellitus without complication ([place])   . GERD (gastroesophageal reflux disease)   . Hyperlipidemia   . Hypertension   . Pre-diabetes    Past Surgical History:  Procedure Laterality Date  . APPENDECTOMY    . VASECTOMY       Current Outpatient Medications  Medication Sig Dispense Refill  . ALPRAZolam (XANAX) 0.25 MG tablet Take 0.25 mg by mouth in the morning and at bedtime.     Marland Kitchen aspirin EC 81 MG tablet Take 81 mg by mouth daily.    Marland Kitchen diltiazem (CARDIZEM CD) 120 MG 24 hr capsule Take 1 capsule  (120 mg total) by mouth daily. 30 capsule 1  . ezetimibe (ZETIA) 10 MG tablet Take 10 mg by mouth daily.    . metFORMIN (GLUCOPHAGE) 500 MG tablet Take 1,000 mg by mouth 2 (two) times daily with a meal.     . Multiple Vitamins-Minerals (VITAMIN C EFFERVESCENT BLEND PO) Take 1 packet by mouth daily.    Marland Kitchen omeprazole (PRILOSEC) 40 MG capsule Take 40 mg by mouth daily.     No current facility-administered medications for this visit.    Allergies:   Macrodantin [nitrofurantoin macrocrystal], Clindamycin/lincomycin, and Septra [sulfamethoxazole-trimethoprim]   Social History:  The patient  reports that he has never smoked. He has never used smokeless tobacco. He reports current alcohol use. He reports that he does not use drugs.   Family History:  The patient's family history includes Diabetes in his mother; Heart attack in his father.    ROS:  Please see the history of present illness.   Otherwise, review of systems is positive for none.   All other systems are reviewed and negative.    PHYSICAL EXAM: VS:  There were no vitals taken for this visit. , BMI There is no height or weight on file to calculate BMI. GEN: Well nourished, well developed, in no acute distress  HEENT: normal  Neck: no JVD, carotid bruits, or masses Cardiac: RRR; no murmurs, rubs, or gallops,no edema  Respiratory:  clear to auscultation bilaterally, normal work of breathing  GI: soft, nontender, nondistended, + BS MS: no deformity or atrophy  Skin: warm and dry Neuro:  Strength and sensation are intact Psych: euthymic mood, full affect  EKG:  EKG is ordered today. Personal review of the ekg ordered shows sinus rhythm, rate 65  Recent Labs: 06/10/2020: BUN 15; Creatinine, Ser 1.01; Hemoglobin 16.0; Magnesium 1.9; Platelets 292; Potassium 4.1; Sodium 140; TSH 1.701    Lipid Panel  No results found for: CHOL, TRIG, HDL, CHOLHDL, VLDL, LDLCALC, LDLDIRECT   Wt Readings from Last 3 Encounters:  06/25/20 202 lb  (91.6 kg)  06/10/20 194 lb (88 kg)  05/31/20 194 lb (88 kg)      Other studies Reviewed: Additional studies/ records that were reviewed today include: TTE 06/11/20  Review of the above records today demonstrates:  1. Left ventricular ejection fraction, by estimation, is 55 to 60%. The  left ventricle has normal function. The left ventricle has no regional  wall motion abnormalities. There is mild left ventricular hypertrophy.  Left ventricular diastolic parameters  are indeterminate.  2. Right ventricular systolic function is normal. The right ventricular  size is normal. Tricuspid regurgitation signal is inadequate for assessing  PA pressure.  3. The mitral valve is grossly normal. Trivial mitral valve  regurgitation.  4. The aortic valve is tricuspid. Aortic valve regurgitation is not  visualized.  5. The inferior vena cava is normal in size with greater than 50%  respiratory variability, suggesting right atrial pressure of 3 mmHg.   ASSESSMENT AND PLAN:  1.  SVT: Initially diagnosed on 06/10/2020 after an emergency room visit.  Is currently on diltiazem.  I have offered ablation versus continued diltiazem.  Risks and benefits of ablation were discussed include bleeding, tamponade, heart block, stroke, damage to other organs.  At this point, he would like to try medication management.  We Edward Armstrong increase diltiazem to 180 mg.  2.  Hypertension: Currently well controlled  3.  Hyperlipidemia: Continue Zetia per PCP  Case discussed with primary cardiology  Current medicines are reviewed at length with the patient today.   The patient does not have concerns regarding his medicines.  The following changes were made today: Increase diltiazem  Labs/ tests ordered today include:  No orders of the defined types were placed in this encounter.    Disposition:   FU with Edward Armstrong 3 months  Signed, Kristen Fromm Meredith Leeds, MD  07/04/2020 9:04 AM     [facility] [address] [place] [address] [phone] (office) [phone] (fax)

## 2020-07-04 NOTE — Progress Notes (Signed)
   [facility]  Name:  Edward Armstrong.    MRN: [id number] DOB: April 08, 1951  07/04/2020  Mr. Edward Armstrong was observed post Covid-19 immunization for 15 minutes without incident. He was provided with Vaccine Information Sheet and instruction to access the V-Safe system.   Mr. Edward Armstrong was instructed to call 911 with any severe reactions post vaccine: Marland Kitchen Difficulty breathing  . Swelling of face and throat  . A fast heartbeat  . A bad rash all over body  . Dizziness and weakness

## 2020-07-08 ENCOUNTER — Encounter: Payer: Self-pay | Admitting: Dermatology

## 2020-07-08 ENCOUNTER — Telehealth: Payer: Self-pay | Admitting: Cardiology

## 2020-07-08 DIAGNOSIS — I471 Supraventricular tachycardia: Secondary | ICD-10-CM

## 2020-07-08 DIAGNOSIS — Z01812 Encounter for preprocedural laboratory examination: Secondary | ICD-10-CM

## 2020-07-08 NOTE — Telephone Encounter (Signed)
Patient called and requested to speak with Edward Armstrong. He had an appointment with Dr. Curt Bears on 11/04 and said [name] told him to call her and let her know if he decides he wants to go through with procedure. Please call/advise.   Thank you!

## 2020-07-08 NOTE — Progress Notes (Signed)
   Follow-Up Visit   Subjective  Edward Armstrong. is a 69 y.o. male who presents for the following: Annual Exam (no new concerns).  General skin examination Location: Dark spot right side of neck is enlarged Duration:  Quality:  Associated Signs/Symptoms: Modifying Factors:  Severity:  Timing: Context:   Objective  Well appearing patient in no apparent distress; mood and affect are within normal limits.  A full examination was performed including scalp, head, eyes, ears, nose, lips, neck, chest, axillae, abdomen, back, buttocks, bilateral upper extremities, bilateral lower extremities, hands, feet, fingers, toes, fingernails, and toenails. All findings within normal limits unless otherwise noted below.   Assessment & Plan    Neoplasm of uncertain behavior of skin Right Anterior Neck  Skin / nail biopsy Type of biopsy: tangential   Informed consent: discussed and consent obtained   Timeout: patient name, date of birth, surgical site, and procedure verified   Procedure prep:  Patient was prepped and draped in usual sterile fashion (Non sterile) Prep type:  Chlorhexidine Anesthesia: the lesion was anesthetized in a standard fashion   Anesthetic:  1% lidocaine w/ epinephrine 1-100,000 local infiltration Instrument used: flexible razor blade   Outcome: patient tolerated procedure well   Post-procedure details: wound care instructions given    Specimen 1 - Surgical pathology Differential Diagnosis: r/o atypia,sk Check Margins: No  Encounter for screening for malignant neoplasm of skin Neck - Anterior  Yearly skin check  AK (actinic keratosis) (2) Left Buccal Cheek ; Left Postauricular Area  Destruction of lesion - Left Buccal Cheek , Left Postauricular Area Complexity: simple   Destruction method: cryotherapy   Informed consent: discussed and consent obtained   Timeout:  patient name, date of birth, surgical site, and procedure verified Lesion destroyed using  liquid nitrogen: Yes   Cryotherapy cycles:  5 Outcome: patient tolerated procedure well with no complications     Skin cancer screening performed today.    I, Lavonna Monarch, MD, have reviewed all documentation for this visit.  The documentation on 07/08/20 for the exam, diagnosis, procedures, and orders are all accurate and complete.

## 2020-07-09 NOTE — Telephone Encounter (Signed)
Pt would like to proceed with ablation. Scheduled Covid screening for  12/1 SVT ablation scheduled for 12/3 Procedure instructions reviewed with pt. Aware office will call to arrange post procedure follow up. Patient verbalized understanding and agreeable to plan.

## 2020-07-12 MED ORDER — DILTIAZEM HCL 30 MG PO TABS: 30.0000 mg | ORAL_TABLET | Freq: Four times a day (QID) | ORAL | 1 refills | Status: DC | PRN

## 2020-07-12 NOTE — Telephone Encounter (Signed)
Pt advised Diltiazem 30 mg q6h prn being sent to pharmacy, approved by Dr. Curt Bears. Pt appreciates our help with this.

## 2020-07-19 DIAGNOSIS — I471 Supraventricular tachycardia: Secondary | ICD-10-CM | POA: Diagnosis not present

## 2020-07-19 DIAGNOSIS — Z01812 Encounter for preprocedural laboratory examination: Secondary | ICD-10-CM | POA: Diagnosis not present

## 2020-07-20 LAB — CBC WITH DIFFERENTIAL/PLATELET
Basophils Absolute: 0.1 10*3/uL (ref 0.0–0.2)
Basos: 1 %
EOS (ABSOLUTE): 0.4 10*3/uL (ref 0.0–0.4)
Eos: 5 %
Hematocrit: 41.5 % (ref 37.5–51.0)
Hemoglobin: 14.6 g/dL (ref 13.0–17.7)
Immature Grans (Abs): 0 10*3/uL (ref 0.0–0.1)
Immature Granulocytes: 0 %
Lymphocytes Absolute: 2.5 10*3/uL (ref 0.7–3.1)
Lymphs: 35 %
MCH: 31 pg (ref 26.6–33.0)
MCHC: 35.2 g/dL (ref 31.5–35.7)
MCV: 88 fL (ref 79–97)
Monocytes Absolute: 0.5 10*3/uL (ref 0.1–0.9)
Monocytes: 7 %
Neutrophils Absolute: 3.7 10*3/uL (ref 1.4–7.0)
Neutrophils: 52 %
Platelets: 275 10*3/uL (ref 150–450)
RBC: 4.71 x10E6/uL (ref 4.14–5.80)
RDW: 11.7 % (ref 11.6–15.4)
WBC: 7.2 10*3/uL (ref 3.4–10.8)

## 2020-07-20 LAB — BASIC METABOLIC PANEL
BUN/Creatinine Ratio: 15 (ref 10–24)
BUN: 17 mg/dL (ref 8–27)
CO2: 25 mmol/L (ref 20–29)
Calcium: 9.3 mg/dL (ref 8.6–10.2)
Chloride: 103 mmol/L (ref 96–106)
Creatinine, Ser: 1.1 mg/dL (ref 0.76–1.27)
GFR calc Af Amer: 79 mL/min/{1.73_m2} (ref >59)
GFR calc non Af Amer: 69 mL/min/{1.73_m2} (ref >59)
Glucose: 151 mg/dL — ABNORMAL HIGH (ref 65–99)
Potassium: 4.4 mmol/L (ref 3.5–5.2)
Sodium: 140 mmol/L (ref 134–144)

## 2020-07-31 ENCOUNTER — Other Ambulatory Visit (HOSPITAL_COMMUNITY)
Admission: RE | Admit: 2020-07-31 | Discharge: 2020-07-31 | Disposition: A | Discharge status: Home / Self Care | Payer: Medicare Other | Source: Ambulatory Visit | Attending: Cardiology | Admitting: Cardiology

## 2020-07-31 DIAGNOSIS — Z20822 Contact with and (suspected) exposure to covid-19: Secondary | ICD-10-CM | POA: Diagnosis not present

## 2020-07-31 DIAGNOSIS — Z01812 Encounter for preprocedural laboratory examination: Secondary | ICD-10-CM | POA: Diagnosis not present

## 2020-07-31 LAB — SARS CORONAVIRUS 2 (TAT 6-24 HRS): SARS Coronavirus 2: NEGATIVE

## 2020-08-01 NOTE — Progress Notes (Signed)
Instructed patient on the following items: Arrival time 1130 Nothing to eat or drink after midnight No meds AM of procedure Responsible person to drive you home and stay with you for 24 hrs   

## 2020-08-02 ENCOUNTER — Ambulatory Visit (HOSPITAL_COMMUNITY): Payer: Medicare Other | Admitting: Certified Registered Nurse Anesthetist

## 2020-08-02 ENCOUNTER — Encounter (HOSPITAL_COMMUNITY)
Admission: RE | Disposition: A | Discharge status: Home / Self Care | Payer: Self-pay | Source: Home / Self Care | Attending: Cardiology

## 2020-08-02 ENCOUNTER — Ambulatory Visit (HOSPITAL_COMMUNITY)
Admission: RE | Admit: 2020-08-02 | Discharge: 2020-08-02 | Disposition: A | Discharge status: Home / Self Care | Payer: Medicare Other | Attending: Cardiology | Admitting: Cardiology

## 2020-08-02 ENCOUNTER — Other Ambulatory Visit: Payer: Self-pay

## 2020-08-02 DIAGNOSIS — K219 Gastro-esophageal reflux disease without esophagitis: Secondary | ICD-10-CM | POA: Diagnosis not present

## 2020-08-02 DIAGNOSIS — E785 Hyperlipidemia, unspecified: Secondary | ICD-10-CM | POA: Insufficient documentation

## 2020-08-02 DIAGNOSIS — I471 Supraventricular tachycardia: Secondary | ICD-10-CM | POA: Diagnosis not present

## 2020-08-02 DIAGNOSIS — E119 Type 2 diabetes mellitus without complications: Secondary | ICD-10-CM | POA: Diagnosis not present

## 2020-08-02 DIAGNOSIS — I10 Essential (primary) hypertension: Secondary | ICD-10-CM | POA: Diagnosis not present

## 2020-08-02 HISTORY — PX: SVT ABLATION: EP1225

## 2020-08-02 LAB — GLUCOSE, CAPILLARY
Glucose-Capillary: 117 mg/dL — ABNORMAL HIGH (ref 70–99)
Glucose-Capillary: 122 mg/dL — ABNORMAL HIGH (ref 70–99)

## 2020-08-02 SURGERY — SVT ABLATION
Anesthesia: General

## 2020-08-02 MED ORDER — SODIUM CHLORIDE 0.9% FLUSH: 3.0000 mL | Freq: Two times a day (BID) | INTRAVENOUS | Status: DC

## 2020-08-02 MED ORDER — ISOPROTERENOL HCL 0.2 MG/ML IJ SOLN
INTRAMUSCULAR | Status: AC
  Filled 2020-08-02: qty 5

## 2020-08-02 MED ORDER — BUPIVACAINE HCL (PF) 0.25 % IJ SOLN
INTRAMUSCULAR | Status: AC
  Filled 2020-08-02: qty 30

## 2020-08-02 MED ORDER — DILTIAZEM HCL ER COATED BEADS 240 MG PO CP24: 240.0000 mg | ORAL_CAPSULE | Freq: Every day | ORAL | 3 refills | Status: DC

## 2020-08-02 MED ORDER — ONDANSETRON HCL 4 MG/2ML IJ SOLN: 4.0000 mg | Freq: Four times a day (QID) | INTRAMUSCULAR | Status: DC | PRN

## 2020-08-02 MED ORDER — MIDAZOLAM HCL 5 MG/5ML IJ SOLN
INTRAMUSCULAR | Status: DC | PRN
  Administered 2020-08-02: 2 mg via INTRAVENOUS

## 2020-08-02 MED ORDER — HEPARIN (PORCINE) IN NACL 1000-0.9 UT/500ML-% IV SOLN
INTRAVENOUS | Status: DC | PRN
  Administered 2020-08-02: 500 mL

## 2020-08-02 MED ORDER — ISOPROTERENOL HCL 0.2 MG/ML IJ SOLN
INTRAVENOUS | Status: DC | PRN
  Administered 2020-08-02: 2 ug/kg/min via INTRAVENOUS

## 2020-08-02 MED ORDER — ACETAMINOPHEN 325 MG PO TABS: 650.0000 mg | ORAL_TABLET | ORAL | Status: DC | PRN

## 2020-08-02 MED ORDER — DOBUTAMINE IN D5W 4-5 MG/ML-% IV SOLN: INTRAVENOUS | Status: DC | PRN

## 2020-08-02 MED ORDER — SODIUM CHLORIDE 0.9% FLUSH: 3.0000 mL | INTRAVENOUS | Status: DC | PRN

## 2020-08-02 MED ORDER — SODIUM CHLORIDE 0.9 % IV SOLN: INTRAVENOUS | Status: DC

## 2020-08-02 MED ORDER — SODIUM CHLORIDE 0.9 % IV SOLN: 250.0000 mL | INTRAVENOUS | Status: DC | PRN

## 2020-08-02 MED ORDER — BUPIVACAINE HCL (PF) 0.25 % IJ SOLN
INTRAMUSCULAR | Status: DC | PRN
  Administered 2020-08-02: 30 mL

## 2020-08-02 MED ORDER — PROPOFOL 500 MG/50ML IV EMUL
INTRAVENOUS | Status: DC | PRN
  Administered 2020-08-02: 50 ug/kg/min via INTRAVENOUS

## 2020-08-02 SURGICAL SUPPLY — 13 items
BLANKET WARM UNDERBOD FULL ACC (MISCELLANEOUS) ×3 IMPLANT
CATH EZ STEER NAV 4MM D-F CUR (ABLATOR) ×3 IMPLANT
CATH JOSEPH QUAD ALLRED 6F REP (CATHETERS) ×6 IMPLANT
CATH WEBSTER BI DIR CS D-F CRV (CATHETERS) ×3 IMPLANT
CLOSURE PERCLOSE PROSTYLE (VASCULAR PRODUCTS) ×15 IMPLANT
PACK EP LATEX FREE (CUSTOM PROCEDURE TRAY) ×3
PACK EP LF (CUSTOM PROCEDURE TRAY) ×1 IMPLANT
PAD PRO RADIOLUCENT 2001M-C (PAD) ×3 IMPLANT
PATCH CARTO3 (PAD) ×3 IMPLANT
SHEATH PINNACLE 6F 10CM (SHEATH) ×6 IMPLANT
SHEATH PINNACLE 7F 10CM (SHEATH) ×3 IMPLANT
SHEATH PINNACLE 8F 10CM (SHEATH) ×3 IMPLANT
SHEATH PROBE COVER 6X72 (BAG) ×3 IMPLANT

## 2020-08-02 NOTE — Anesthesia Procedure Notes (Signed)
Procedure Name: MAC Date/Time: 08/02/2020 3:07 PM Performed by: Lowella Dell, CRNA Pre-anesthesia Checklist: Patient identified, Emergency Drugs available, Patient being monitored, Suction available and Timeout performed Patient Re-evaluated:Patient Re-evaluated prior to induction Oxygen Delivery Method: Simple face mask Placement Confirmation: positive ETCO2 Dental Injury: Teeth and Oropharynx as per pre-operative assessment

## 2020-08-02 NOTE — Progress Notes (Signed)
Patient and wife was given discharge instructions. Both verbalized understanding. 

## 2020-08-02 NOTE — Discharge Instructions (Signed)
Post procedure care instructions No driving for 4 days. No lifting over 5 lbs for 1 week. No vigorous or sexual activity for 1 week. You may return to work/your usual activities on 08/09/2020. Keep procedure site clean & dry. If you notice increased pain, swelling, bleeding or pus, call/return!  You may shower after 24 hours, but no soaking in baths/hot tubs/pools for 1 week.   Cardiac Ablation, Care After  This sheet gives you information about how to care for yourself after your procedure. Your health care provider may also give you more specific instructions. If you have problems or questions, contact your health care provider. What can I expect after the procedure? After the procedure, it is common to have:  Bruising around your puncture site.  Tenderness around your puncture site.  Skipped heartbeats.  Tiredness (fatigue).  Follow these instructions at home: Puncture site care   Follow instructions from your health care provider about how to take care of your puncture site. Make sure you: ? If present, leave stitches (sutures), skin glue, or adhesive strips in place. These skin closures may need to stay in place for up to 2 weeks. If adhesive strip edges start to loosen and curl up, you may trim the loose edges. Do not remove adhesive strips completely unless your health care provider tells you to do that. ? If a large square bandage is present, this may be removed 24 hours after surgery.   Check your puncture site every day for signs of infection. Check for: ? Redness, swelling, or pain. ? Fluid or blood. If your puncture site starts to bleed, lie down on your back, apply firm pressure to the area, and contact your health care provider. ? Warmth. ? Pus or a bad smell. Driving  Do not drive for at least 4 days after your procedure or however long your health care provider recommends. (Do not resume driving if you have previously been instructed not to drive for other health  reasons.)  Do not drive or use heavy machinery while taking prescription pain medicine. Activity  Avoid activities that take a lot of effort for at least 7 days after your procedure.  Do not lift anything that is heavier than 5 lb (4.5 kg) for one week.   No sexual activity for 1 week.   Return to your normal activities as told by your health care provider. Ask your health care provider what activities are safe for you. General instructions  Take over-the-counter and prescription medicines only as told by your health care provider.  Do not use any products that contain nicotine or tobacco, such as cigarettes and e-cigarettes. If you need help quitting, ask your health care provider.  You may shower after 24 hours, but Do not take baths, swim, or use a hot tub for 1 week.   Do not drink alcohol for 24 hours after your procedure.  Keep all follow-up visits as told by your health care provider. This is important. Contact a health care provider if:  You have redness, mild swelling, or pain around your puncture site.  You have fluid or blood coming from your puncture site that stops after applying firm pressure to the area.  Your puncture site feels warm to the touch.  You have pus or a bad smell coming from your puncture site.  You have a fever.  You have chest pain or discomfort that spreads to your neck, jaw, or arm.  You are sweating a lot.  You feel  nauseous.  You have a fast or irregular heartbeat.  You have shortness of breath.  You are dizzy or light-headed and feel the need to lie down.  You have pain or numbness in the arm or leg closest to your puncture site. Get help right away if:  Your puncture site suddenly swells.  Your puncture site is bleeding and the bleeding does not stop after applying firm pressure to the area. These symptoms may represent a serious problem that is an emergency. Do not wait to see if the symptoms will go away. Get medical help  right away. Call your local emergency services (911 in the [place] Do not drive yourself to the hospital. Summary  After the procedure, it is normal to have bruising and tenderness at the puncture site in your groin, neck, or forearm.  Check your puncture site every day for signs of infection.  Get help right away if your puncture site is bleeding and the bleeding does not stop after applying firm pressure to the area. This is a medical emergency. This information is not intended to replace advice given to you by your health care provider. Make sure you discuss any questions you have with your health care provider.

## 2020-08-02 NOTE — Anesthesia Preprocedure Evaluation (Addendum)
Anesthesia Evaluation  Patient identified by MRN, date of birth, ID band Patient awake    Reviewed: Allergy & Precautions, NPO status , Patient's Chart, lab work & pertinent test results  History of Anesthesia Complications (+) PROLONGED EMERGENCE  Airway Mallampati: II  TM Distance: >3 FB Neck ROM: Full    Dental  (+) Dental Advisory Given   Pulmonary neg pulmonary ROS,  07/31/2020 SARS coronavirus NEG   breath sounds clear to auscultation       Cardiovascular hypertension, Pt. on medications (-) angina+ dysrhythmias Supra Ventricular Tachycardia  Rhythm:Regular Rate:Normal  05/2020 ECHO: EF 55-60%, normal LVF, no significant valvular abnormalities   Neuro/Psych negative neurological ROS     GI/Hepatic Neg liver ROS, GERD  Medicated and Controlled,  Endo/Other  diabetes (glu 117), Oral Hypoglycemic Agents  Renal/GU negative Renal ROS     Musculoskeletal   Abdominal   Peds  Hematology   Anesthesia Other Findings   Reproductive/Obstetrics                            Anesthesia Physical Anesthesia Plan  ASA: III  Anesthesia Plan: MAC   Post-op Pain Management:    Induction:   PONV Risk Score and Plan: 1 and Ondansetron  Airway Management Planned: Natural Airway and Simple Face Mask  Additional Equipment: None  Intra-op Plan:   Post-operative Plan:   Informed Consent: I have reviewed the patients History and Physical, chart, labs and discussed the procedure including the risks, benefits and alternatives for the proposed anesthesia with the patient or authorized representative who has indicated his/her understanding and acceptance.     Dental advisory given  Plan Discussed with: CRNA and Surgeon  Anesthesia Plan Comments:        Anesthesia Quick Evaluation

## 2020-08-02 NOTE — Transfer of Care (Signed)
Immediate Anesthesia Transfer of Care Note  Patient: Edward Armstrong.  Procedure(s) Performed: SVT ABLATION (N/A )  Patient Location: PACU and Cath Lab  Anesthesia Type:MAC  Level of Consciousness: awake and patient cooperative  Airway & Oxygen Therapy: Patient Spontanous Breathing and Patient connected to nasal cannula oxygen  Post-op Assessment: Report given to RN and Post -op Vital signs reviewed and stable  Post vital signs: Reviewed and stable  Last Vitals:  Vitals Value Taken Time  BP 150/78 08/02/20 1650  Temp    Pulse 84 08/02/20 1651  Resp 14 08/02/20 1651  SpO2 99 % 08/02/20 1651  Vitals shown include unvalidated device data.  Last Pain:  Vitals:   08/02/20 1154  TempSrc:   PainSc: 0-No pain         Complications: No complications documented.

## 2020-08-02 NOTE — Anesthesia Postprocedure Evaluation (Signed)
Anesthesia Post Note  Patient: Edward Armstrong.  Procedure(s) Performed: SVT ABLATION (N/A )     Patient location during evaluation: Phase II Anesthesia Type: General Level of consciousness: awake and alert, patient cooperative and oriented Pain management: pain level controlled Vital Signs Assessment: post-procedure vital signs reviewed and stable Respiratory status: spontaneous breathing, nonlabored ventilation and respiratory function stable Cardiovascular status: stable and blood pressure returned to baseline Postop Assessment: no apparent nausea or vomiting Anesthetic complications: no   No complications documented.  Last Vitals:  Vitals:   08/02/20 1725 08/02/20 1755  BP: (!) 153/79 (!) 158/89  Pulse: 66 77  Resp: 16 14  Temp:    SpO2: 98% 99%    Last Pain:  Vitals:   08/02/20 1723  TempSrc: Temporal  PainSc: 0-No pain                 Julis Haubner,E. Chrissie Dacquisto

## 2020-08-02 NOTE — Interval H&P Note (Signed)
History and Physical Interval Note:  08/02/2020 2:09 PM  Edward Armstrong.  has presented today for surgery, with the diagnosis of SVT.  The various methods of treatment have been discussed with the patient and family. After consideration of risks, benefits and other options for treatment, the patient has consented to  Procedure(s): SVT ABLATION (N/A) as a surgical intervention.  The patient's history has been reviewed, patient examined, no change in status, stable for surgery.  I have reviewed the patient's chart and labs.  Questions were answered to the patient's satisfaction.     [name] [employer]

## 2020-08-05 ENCOUNTER — Encounter (HOSPITAL_COMMUNITY): Payer: Self-pay | Admitting: Cardiology

## 2020-08-29 DIAGNOSIS — E119 Type 2 diabetes mellitus without complications: Secondary | ICD-10-CM | POA: Diagnosis not present

## 2020-08-30 DIAGNOSIS — G453 Amaurosis fugax: Secondary | ICD-10-CM | POA: Diagnosis not present

## 2020-09-01 ENCOUNTER — Other Ambulatory Visit: Payer: Self-pay

## 2020-09-01 ENCOUNTER — Ambulatory Visit
Admission: EM | Admit: 2020-09-01 | Discharge: 2020-09-01 | Disposition: A | Discharge status: Home / Self Care | Payer: Medicare Other | Attending: Family Medicine | Admitting: Family Medicine

## 2020-09-01 ENCOUNTER — Encounter: Payer: Self-pay | Admitting: Emergency Medicine

## 2020-09-01 DIAGNOSIS — R509 Fever, unspecified: Secondary | ICD-10-CM

## 2020-09-01 DIAGNOSIS — J069 Acute upper respiratory infection, unspecified: Secondary | ICD-10-CM

## 2020-09-01 DIAGNOSIS — Z20822 Contact with and (suspected) exposure to covid-19: Secondary | ICD-10-CM

## 2020-09-01 HISTORY — DX: Supraventricular tachycardia: I47.1

## 2020-09-01 HISTORY — DX: Supraventricular tachycardia, unspecified: I47.10

## 2020-09-01 MED ORDER — ALBUTEROL SULFATE HFA 108 (90 BASE) MCG/ACT IN AERS: 2.0000 | INHALATION_SPRAY | Freq: Once | RESPIRATORY_TRACT | Status: DC

## 2020-09-01 NOTE — Discharge Instructions (Addendum)
Go home to rest Drink plenty of fluids Take Tylenol for pain or fever You may take over-the-counter cough and cold medicines as needed You must quarantine at home until your test result is available You can check for your test result in MyChart  

## 2020-09-01 NOTE — ED Provider Notes (Signed)
[facility]    CSN: [id number] Arrival date & time: 09/01/20  0943      History   Chief Complaint No chief complaint on file.   HPI Edward Armstrong. is a 70 y.o. male.   HPI  Patient states he has had a cough for 2 days.  Fever.  He is taking Tylenol and ibuprofen.  He is here for Covid testing.  He did a Covid test at [facility] that was negative.  He is concerned because he has been spending a lot of time around his children and grandchildren and wants to be certain he does not have anything contagious.  He is Covid vaccinated.  No shortness of breath.  No headache or body ache. He does have concerned because his heart rate has been elevated. He also has a history of heart disease and is under the care of cardiology  Past Medical History:  Diagnosis Date  . Allergy   . Anxiety   . Depression   . Diabetes mellitus without complication ([place])   . GERD (gastroesophageal reflux disease)   . Hyperlipidemia   . Hypertension   . Pre-diabetes   . SVT (supraventricular tachycardia) [facility])     Patient Active Problem List   Diagnosis Date Noted  . SVT (supraventricular tachycardia) ([place]) 06/10/2020  . Palpitations 06/10/2020  . Chest pressure 06/10/2020  . Essential hypertension 06/10/2020  . Hyperlipidemia 06/10/2020  . Diabetes mellitus type 2, noninsulin dependent ([place]) 06/10/2020  . Left shoulder pain 02/21/2013  . Rotator cuff syndrome of left shoulder 02/21/2013    Past Surgical History:  Procedure Laterality Date  . APPENDECTOMY    . SVT ABLATION N/A 08/02/2020   Procedure: SVT ABLATION;  Surgeon: Constance Haw, MD;  Location: [facility];  Service: Cardiovascular;  Laterality: N/A;  . VASECTOMY         Home Medications    Prior to Admission medications   Medication Sig Start Date End Date Taking? Authorizing Provider  acetaminophen (TYLENOL) 500 MG tablet Take 1,000 mg by mouth every 6 (six) hours as needed for mild pain or headache.    Yes [provider]  ALPRAZolam (XANAX) 0.25 MG tablet Take 0.25 mg by mouth in the morning and at bedtime.   Yes [provider]  aspirin EC 81 MG tablet Take 81 mg by mouth daily.   Yes [provider]  diltiazem (CARDIZEM CD) 240 MG 24 hr capsule Take 1 capsule (240 mg total) by mouth daily. 08/02/20  Yes Camnitz, Will Hassell Done, MD  diltiazem (CARDIZEM) 30 MG tablet Take 1 tablet (30 mg total) by mouth every 6 (six) hours as needed (for elevated heart rates). 07/12/20  Yes Camnitz, Will Hassell Done, MD  ezetimibe (ZETIA) 10 MG tablet Take 10 mg by mouth at bedtime.    Yes [provider]  metFORMIN (GLUCOPHAGE) 1000 MG tablet Take 1,000 mg by mouth 2 (two) times daily with a meal.    Yes [provider]  Multiple Vitamins-Minerals (VITAMIN C EFFERVESCENT BLEND PO) Take 1 packet by mouth daily.   Yes [provider]  omeprazole (PRILOSEC) 40 MG capsule Take 40 mg by mouth daily.   Yes [provider]  Propylene Glycol (SYSTANE COMPLETE OP) Place 1 drop into both eyes in the morning and at bedtime.   Yes [provider]    Family History Family History  Problem Relation Age of Onset  . Diabetes Mother   . Heart attack Father   .  Colon cancer Neg Hx   . Esophageal cancer Neg Hx   . Liver cancer Neg Hx   . Pancreatic cancer Neg Hx   . Rectal cancer Neg Hx   . Stomach cancer Neg Hx     Social History Social History   Tobacco Use  . Smoking status: Never Smoker  . Smokeless tobacco: Never Used  Vaping Use  . Vaping Use: Never used  Substance Use Topics  . Alcohol use: Yes    Comment: Moderately  . Drug use: No     Allergies   Macrodantin [nitrofurantoin macrocrystal], Clindamycin/lincomycin, and Septra [sulfamethoxazole-trimethoprim]   Review of Systems Review of Systems  See HPI Physical Exam Triage Vital Signs ED Triage Vitals  Enc Vitals Group     BP 09/01/20 1011 116/75     Pulse Rate 09/01/20 1011  91     Resp 09/01/20 1011 18     Temp 09/01/20 1011 98.4 F (36.9 C)     Temp Source 09/01/20 1011 Oral     SpO2 09/01/20 1011 96 %     Weight 09/01/20 1008 196 lb 3.4 oz (89 kg)     Height 09/01/20 1008 5\' 11"  (1.803 m)     Head Circumference --      Peak Flow --      Pain Score 09/01/20 1008 0     Pain Loc --      Pain Edu? --      Excl. in GC? --    No data found.  Updated Vital Signs BP 116/75 (BP Location: Right Arm)   Pulse 91   Temp 98.4 F (36.9 C) (Oral)   Resp 18   Ht 5\' 11"  (1.803 m)   Wt 89 kg   SpO2 96%   BMI 27.37 kg/m      Physical Exam Constitutional:      General: He is not in acute distress.    Appearance: He is well-developed and well-nourished.     Comments: Well-appearing  HENT:     Head: Normocephalic and atraumatic.     Right Ear: Tympanic membrane and ear canal normal.     Left Ear: Tympanic membrane and ear canal normal.     Nose: Congestion present.     Mouth/Throat:     Pharynx: Posterior oropharyngeal erythema present.  Eyes:     Conjunctiva/sclera: Conjunctivae normal.     Pupils: Pupils are equal, round, and reactive to light.  Cardiovascular:     Rate and Rhythm: Normal rate and regular rhythm.     Heart sounds: Normal heart sounds.  Pulmonary:     Effort: Pulmonary effort is normal. No respiratory distress.     Breath sounds: Normal breath sounds.     Comments: Lungs are clear Abdominal:     General: There is no distension.     Palpations: Abdomen is soft.  Musculoskeletal:        General: No edema. Normal range of motion.     Cervical back: Normal range of motion.  Skin:    General: Skin is warm and dry.  Neurological:     Mental Status: He is alert.  Psychiatric:        Behavior: Behavior normal.      UC Treatments / Results  Labs (all labs ordered are listed, but only abnormal results are displayed) Labs Reviewed  COVID-19, FLU A+B NAA    EKG   Radiology No results found.  Procedures Procedures  (including critical care time)  Medications Ordered in UC Medications - No data to display  Initial Impression / Assessment and Plan / UC Course  I have reviewed the triage vital signs and the nursing notes.  Pertinent labs & imaging results that were available during my care of the patient were reviewed by me and considered in my medical decision making (see chart for details).      Final Clinical Impressions(s) / UC Diagnoses   Final diagnoses:  Fever, unspecified  Viral URI with cough  Encounter for laboratory testing for COVID-19 virus     Discharge Instructions     Go home to rest Drink plenty of fluids Take Tylenol for pain or fever You may take over-the-counter cough and cold medicines as needed You must quarantine at home until your test result is available You can check for your test result in MyChart    ED Prescriptions    None     PDMP not reviewed this encounter.   Raylene Everts, MD 09/01/20 1055

## 2020-09-01 NOTE — ED Triage Notes (Signed)
Cough x 2 days, fever started yesterday evening. Neg rapid covid test done yesterday.  Reports his temp was 100.4 this morning, took 1 gram of tylenol at 0830

## 2020-09-02 ENCOUNTER — Ambulatory Visit: Payer: Self-pay

## 2020-09-02 ENCOUNTER — Other Ambulatory Visit (HOSPITAL_COMMUNITY): Payer: Self-pay | Admitting: Internal Medicine

## 2020-09-02 ENCOUNTER — Other Ambulatory Visit: Payer: Self-pay | Admitting: Internal Medicine

## 2020-09-02 DIAGNOSIS — G453 Amaurosis fugax: Secondary | ICD-10-CM

## 2020-09-04 LAB — COVID-19, FLU A+B NAA
Influenza A, NAA: NOT DETECTED
Influenza B, NAA: NOT DETECTED
SARS-CoV-2, NAA: NOT DETECTED

## 2020-09-05 ENCOUNTER — Ambulatory Visit: Payer: Medicare Other | Admitting: Cardiology

## 2020-09-06 ENCOUNTER — Other Ambulatory Visit: Payer: Self-pay

## 2020-09-06 ENCOUNTER — Ambulatory Visit (HOSPITAL_COMMUNITY)
Admission: RE | Admit: 2020-09-06 | Discharge: 2020-09-06 | Disposition: A | Discharge status: Home / Self Care | Payer: Medicare Other | Source: Ambulatory Visit | Attending: Internal Medicine | Admitting: Internal Medicine

## 2020-09-06 DIAGNOSIS — E041 Nontoxic single thyroid nodule: Secondary | ICD-10-CM | POA: Diagnosis not present

## 2020-09-06 DIAGNOSIS — Z8673 Personal history of transient ischemic attack (TIA), and cerebral infarction without residual deficits: Secondary | ICD-10-CM | POA: Diagnosis not present

## 2020-09-06 DIAGNOSIS — I6523 Occlusion and stenosis of bilateral carotid arteries: Secondary | ICD-10-CM | POA: Diagnosis not present

## 2020-09-06 DIAGNOSIS — G453 Amaurosis fugax: Secondary | ICD-10-CM | POA: Diagnosis not present

## 2020-09-09 ENCOUNTER — Ambulatory Visit (INDEPENDENT_AMBULATORY_CARE_PROVIDER_SITE_OTHER): Payer: Medicare Other | Admitting: Cardiology

## 2020-09-09 ENCOUNTER — Encounter: Payer: Self-pay | Admitting: Cardiology

## 2020-09-09 ENCOUNTER — Other Ambulatory Visit: Payer: Self-pay

## 2020-09-09 VITALS — BP 142/64 | HR 61 | Ht 71.0 in | Wt 205.6 lb

## 2020-09-09 DIAGNOSIS — I471 Supraventricular tachycardia: Secondary | ICD-10-CM | POA: Diagnosis not present

## 2020-09-09 MED ORDER — METOPROLOL SUCCINATE ER 100 MG PO TB24: 100.0000 mg | ORAL_TABLET | Freq: Every day | ORAL | 3 refills | Status: DC

## 2020-09-09 NOTE — Patient Instructions (Signed)
Medication Instructions:  Your physician has recommended you make the following change in your medication:  1. STOP Diltiazem 2. START Metoprolol Succinate (Toprol) 100 mg daily at bedtime  *If you need a refill on your cardiac medications before your next appointment, please call your pharmacy*   Lab Work: None ordered   Testing/Procedures: None ordered   Follow-Up: At [facility], you and your health needs are our priority.  As part of our continuing mission to provide you with exceptional heart care, we have created designated Provider Care Teams.  These Care Teams include your primary Cardiologist (physician) and Advanced Practice Providers (APPs -  Physician Assistants and Nurse Practitioners) who all work together to provide you with the care you need, when you need it.  Your next appointment:   3 month(s)  The format for your next appointment:   In Person  Provider:   Allegra Lai, MD    Thank you for choosing [facility]!!   Trinidad Curet, RN [phone]   Other Instructions

## 2020-09-09 NOTE — Progress Notes (Signed)
Electrophysiology Office Note   Date:  09/09/2020   ID:  Edward Weightman., DOB [birth year], MRN [id number]  PCP:  Asencion Noble, MD  Cardiologist:  Domenic Polite Primary Electrophysiologist:  Enes Wegener Meredith Leeds, MD    Chief Complaint: SVT   History of Present Illness: Edward Julia. is a 70 y.o. male who is being seen today for the evaluation of SVT at the request of Asencion Noble, MD. Presenting today for electrophysiology evaluation.  Is a history significant for hypertension, non-insulin-dependent diabetes, and SVT.  He wore a cardiac monitor that showed sinus rhythm with rare ectopy.  On 06/10/2020 he developed rapid tachycardia with chest pain.  He presented to the emergency room with a heart rate of 173 terminated spontaneously and was started on diltiazem.  He had an attempt at ablation.  During his EP study, he had very short episodes of tachycardia that occurred while pacing.  With termination of atrial pacing, tachycardia also terminated and thus no testing of tachycardia could be performed.  His diltiazem dose was increased.  Today, denies symptoms of palpitations, chest pain, shortness of breath, orthopnea, PND, lower extremity edema, claudication, dizziness, presyncope, syncope, bleeding, or neurologic sequela. The patient is tolerating medications without difficulties.  He has had a few episodes of palpitations since his EP study.  None of them are long lasting, only a few seconds at a time.  He does have a cardia mobile and he brings in recordings that showed sinus rhythm.  I have encouraged him that he has not had any major arrhythmias.    Past Medical History:  Diagnosis Date  . Allergy   . Anxiety   . Depression   . Diabetes mellitus without complication ([place])   . GERD (gastroesophageal reflux disease)   . Hyperlipidemia   . Hypertension   . Pre-diabetes   . SVT (supraventricular tachycardia) (HCC)    Past Surgical History:  Procedure Laterality Date  . APPENDECTOMY     . SVT ABLATION N/A 08/02/2020   Procedure: SVT ABLATION;  Surgeon: Constance Haw, MD;  Location: [facility];  Service: Cardiovascular;  Laterality: N/A;  . VASECTOMY       Current Outpatient Medications  Medication Sig Dispense Refill  . acetaminophen (TYLENOL) 500 MG tablet Take 1,000 mg by mouth every 6 (six) hours as needed for mild pain or headache.    . ALPRAZolam (XANAX) 0.25 MG tablet Take 0.25 mg by mouth in the morning and at bedtime.    Marland Kitchen aspirin EC 81 MG tablet Take 81 mg by mouth daily.    Marland Kitchen diltiazem (CARDIZEM) 30 MG tablet Take 1 tablet (30 mg total) by mouth every 6 (six) hours as needed (for elevated heart rates). 30 tablet 1  . ezetimibe (ZETIA) 10 MG tablet Take 10 mg by mouth at bedtime.     . metFORMIN (GLUCOPHAGE) 1000 MG tablet Take 1,000 mg by mouth 2 (two) times daily with a meal.     . metoprolol succinate (TOPROL-XL) 100 MG 24 hr tablet Take 1 tablet (100 mg total) by mouth daily. Take with or immediately following a meal. 30 tablet 3  . Multiple Vitamins-Minerals (VITAMIN C EFFERVESCENT BLEND PO) Take 1 packet by mouth daily.    Marland Kitchen omeprazole (PRILOSEC) 40 MG capsule Take 40 mg by mouth daily.    Marland Kitchen Propylene Glycol (SYSTANE COMPLETE OP) Place 1 drop into both eyes in the morning and at bedtime.     No current facility-administered medications  for this visit.    Allergies:   Macrodantin [nitrofurantoin macrocrystal], Clindamycin/lincomycin, and Septra [sulfamethoxazole-trimethoprim]   Social History:  The patient  reports that he has never smoked. He has never used smokeless tobacco. He reports current alcohol use. He reports that he does not use drugs.   Family History:  The patient's family history includes Diabetes in his mother; Heart attack in his father.   ROS:  Please see the history of present illness.   Otherwise, review of systems is positive for none.   All other systems are reviewed and negative.   PHYSICAL EXAM: VS:  BP (!) 142/64    Pulse 61   Ht 5\' 11"  (1.803 m)   Wt 205 lb 9.6 oz (93.3 kg)   SpO2 97%   BMI 28.68 kg/m  , BMI Body mass index is 28.68 kg/m. GEN: Well nourished, well developed, in no acute distress  HEENT: normal  Neck: no JVD, carotid bruits, or masses Cardiac: RRR; no murmurs, rubs, or gallops,no edema  Respiratory:  clear to auscultation bilaterally, normal work of breathing GI: soft, nontender, nondistended, + BS MS: no deformity or atrophy  Skin: warm and dry Neuro:  Strength and sensation are intact Psych: euthymic mood, full affect  EKG:  EKG is ordered today. Personal review of the ekg ordered shows sinus rhythm, first-degree AV block  Recent Labs: 06/10/2020: Magnesium 1.9; TSH 1.701 07/19/2020: BUN 17; Creatinine, Ser 1.10; Hemoglobin 14.6; Platelets 275; Potassium 4.4; Sodium 140    Lipid Panel  No results found for: CHOL, TRIG, HDL, CHOLHDL, VLDL, LDLCALC, LDLDIRECT   Wt Readings from Last 3 Encounters:  09/09/20 205 lb 9.6 oz (93.3 kg)  09/01/20 196 lb 3.4 oz (89 kg)  08/02/20 197 lb (89.4 kg)      Other studies Reviewed: Additional studies/ records that were reviewed today include: TTE 06/11/20  Review of the above records today demonstrates:  1. Left ventricular ejection fraction, by estimation, is 55 to 60%. The  left ventricle has normal function. The left ventricle has no regional  wall motion abnormalities. There is mild left ventricular hypertrophy.  Left ventricular diastolic parameters  are indeterminate.  2. Right ventricular systolic function is normal. The right ventricular  size is normal. Tricuspid regurgitation signal is inadequate for assessing  PA pressure.  3. The mitral valve is grossly normal. Trivial mitral valve  regurgitation.  4. The aortic valve is tricuspid. Aortic valve regurgitation is not  visualized.  5. The inferior vena cava is normal in size with greater than 50%  respiratory variability, suggesting right atrial pressure of  3 mmHg.   ASSESSMENT AND PLAN:  1.  SVT: Had an EP study 08/29/2020 with multiple episodes of SVT that occurred during pacing, but would terminate with termination of pacing.  Was unclear as to the cause of his SVT as SVT would not sustain long enough to tested.  He is currently on diltiazem.  He is continuing to have palpitations.  He also has some fatigue on diltiazem.  Due to that we Edward Armstrong switch him to Toprol-XL.  He does have a cardia mobile, but none of the tracings that he brought in show SVT.  2.  Hypertension: Elevated today.  Starting metoprolol.  3.  Hyperlipidemia: Continue Zetia per PCP  Current medicines are reviewed at length with the patient today.   The patient does not have concerns regarding his medicines.  The following changes were made today: Stop diltiazem, start Toprol-XL  Labs/ tests ordered today  include:  Orders Placed This Encounter  Procedures  . EKG 12-Lead     Disposition:   FU with Edward Armstrong 3 months  Signed, Edward Armstrong Meredith Leeds, MD  09/09/2020 2:36 PM     [address] [place] [address] [phone] (office) [phone] (fax)

## 2020-09-24 ENCOUNTER — Other Ambulatory Visit: Payer: Self-pay | Admitting: Internal Medicine

## 2020-09-24 DIAGNOSIS — E041 Nontoxic single thyroid nodule: Secondary | ICD-10-CM

## 2020-09-26 ENCOUNTER — Ambulatory Visit: Payer: Medicare Other | Admitting: Cardiology

## 2020-09-27 ENCOUNTER — Ambulatory Visit (HOSPITAL_COMMUNITY)
Admission: RE | Admit: 2020-09-27 | Discharge: 2020-09-27 | Disposition: A | Discharge status: Home / Self Care | Payer: Medicare Other | Source: Ambulatory Visit | Attending: Internal Medicine | Admitting: Internal Medicine

## 2020-09-27 ENCOUNTER — Other Ambulatory Visit: Payer: Self-pay

## 2020-09-27 DIAGNOSIS — E041 Nontoxic single thyroid nodule: Secondary | ICD-10-CM | POA: Insufficient documentation

## 2020-10-08 NOTE — Telephone Encounter (Signed)
Followed up w/ pt on reported concerns/issues. Resting HRs 30-40s. He reports that he has NO energy.  He sits around d/t fatigue and this "is not my personality". When he goes out for a walk/exercises his HRs are only going up to 90-100.  Normally his target HR w/ exercise was 130-140s but he unable to accomplish this now. Pt aware that BB therapy can cause this in first few weeks-months and that typically subsides overtime. However, I did inform him that Dr. Curt Bears would review tomorrow and advise.  Made aware that he may reduce the Metoprolol down to 50 mg to see if makes improvement in symptoms while still controlling HR. Patient verbalized understanding and agreeable to plan.

## 2020-10-09 MED ORDER — METOPROLOL SUCCINATE ER 50 MG PO TB24: 50.0000 mg | ORAL_TABLET | Freq: Every day | ORAL | 1 refills | Status: DC

## 2020-10-09 NOTE — Telephone Encounter (Signed)
Pt advised to decrease Toprol to 50 mg to see if improvement. Pt aware to let me know in next couple weeks if no improvement in HRs/fatigue. Patient verbalized understanding and agreeable to plan.

## 2020-10-16 DIAGNOSIS — I10 Essential (primary) hypertension: Secondary | ICD-10-CM | POA: Diagnosis not present

## 2020-10-16 DIAGNOSIS — E1159 Type 2 diabetes mellitus with other circulatory complications: Secondary | ICD-10-CM | POA: Diagnosis not present

## 2020-10-21 DIAGNOSIS — I471 Supraventricular tachycardia: Secondary | ICD-10-CM | POA: Diagnosis not present

## 2020-10-21 DIAGNOSIS — E1122 Type 2 diabetes mellitus with diabetic chronic kidney disease: Secondary | ICD-10-CM | POA: Diagnosis not present

## 2020-10-21 DIAGNOSIS — F411 Generalized anxiety disorder: Secondary | ICD-10-CM | POA: Diagnosis not present

## 2020-10-21 DIAGNOSIS — R7309 Other abnormal glucose: Secondary | ICD-10-CM | POA: Diagnosis not present

## 2020-10-22 ENCOUNTER — Ambulatory Visit: Payer: Medicare Other | Admitting: Cardiology

## 2020-11-08 ENCOUNTER — Other Ambulatory Visit: Payer: Self-pay

## 2020-11-08 ENCOUNTER — Ambulatory Visit
Admission: EM | Admit: 2020-11-08 | Discharge: 2020-11-08 | Disposition: A | Discharge status: Home / Self Care | Payer: Medicare Other | Attending: Emergency Medicine | Admitting: Emergency Medicine

## 2020-11-08 ENCOUNTER — Encounter: Payer: Self-pay | Admitting: Emergency Medicine

## 2020-11-08 DIAGNOSIS — H6121 Impacted cerumen, right ear: Secondary | ICD-10-CM | POA: Diagnosis not present

## 2020-11-08 DIAGNOSIS — H6593 Unspecified nonsuppurative otitis media, bilateral: Secondary | ICD-10-CM | POA: Diagnosis not present

## 2020-11-08 MED ORDER — FLUTICASONE PROPIONATE 50 MCG/ACT NA SUSP: 1.0000 | Freq: Every day | NASAL | 0 refills | Status: DC

## 2020-11-08 NOTE — ED Provider Notes (Signed)
[facility]   [id number] 11/08/20 Arrival Time: 1017  CC:EAR PAIN  SUBJECTIVE: History from: patient and family.  Edward Armstrong. is a 70 y.o. male  who presents to the urgent care with a complaint of the right ear feels stopped.  Denies any precipitating event.  Denies ear pain.  Has tried OTC eardrops with no relief.  Symptoms are made worse when trying to hear others.  Reports similar symptoms in the past that resolved with ear lavage.  Denies fever, chills, fatigue, sore throat, sinus pain,, wheezing, sinus pressure, chest pain, nausea, change in bowel or bladder.    ROS: As per HPI.  All other pertinent ROS negative.     Past Medical History:  Diagnosis Date  . Allergy   . Anxiety   . Depression   . Diabetes mellitus without complication ([place])   . GERD (gastroesophageal reflux disease)   . Hyperlipidemia   . Hypertension   . Pre-diabetes   . SVT (supraventricular tachycardia) (HCC)    Past Surgical History:  Procedure Laterality Date  . APPENDECTOMY    . SVT ABLATION N/A 08/02/2020   Procedure: SVT ABLATION;  Surgeon: Constance Haw, MD;  Location: [facility];  Service: Cardiovascular;  Laterality: N/A;  . VASECTOMY     Allergies  Allergen Reactions  . Macrodantin [Nitrofurantoin Macrocrystal] Hives  . Clindamycin/Lincomycin Hives  . Septra [Sulfamethoxazole-Trimethoprim] Hives   No current facility-administered medications on file prior to encounter.   Current Outpatient Medications on File Prior to Encounter  Medication Sig Dispense Refill  . acetaminophen (TYLENOL) 500 MG tablet Take 1,000 mg by mouth every 6 (six) hours as needed for mild pain or headache.    . ALPRAZolam (XANAX) 0.25 MG tablet Take 0.25 mg by mouth in the morning and at bedtime.    Marland Kitchen aspirin EC 81 MG tablet Take 81 mg by mouth daily.    Marland Kitchen diltiazem (CARDIZEM) 30 MG tablet Take 1 tablet (30 mg total) by mouth every 6 (six) hours as needed (for elevated heart rates). 30  tablet 1  . ezetimibe (ZETIA) 10 MG tablet Take 10 mg by mouth at bedtime.     . metFORMIN (GLUCOPHAGE) 1000 MG tablet Take 1,000 mg by mouth 2 (two) times daily with a meal.     . metoprolol succinate (TOPROL-XL) 50 MG 24 hr tablet Take 1 tablet (50 mg total) by mouth daily. Take with or immediately following a meal. 90 tablet 1  . Multiple Vitamins-Minerals (VITAMIN C EFFERVESCENT BLEND PO) Take 1 packet by mouth daily.    Marland Kitchen omeprazole (PRILOSEC) 40 MG capsule Take 40 mg by mouth daily.    Marland Kitchen Propylene Glycol (SYSTANE COMPLETE OP) Place 1 drop into both eyes in the morning and at bedtime.     Social History   Socioeconomic History  . Marital status: Married    Spouse name: Not on file  . Number of children: Not on file  . Years of education: Not on file  . Highest education level: Not on file  Occupational History  . Not on file  Tobacco Use  . Smoking status: Never Smoker  . Smokeless tobacco: Never Used  Vaping Use  . Vaping Use: Never used  Substance and Sexual Activity  . Alcohol use: Yes    Comment: Moderately  . Drug use: No  . Sexual activity: Not on file  Other Topics Concern  . Not on file  Social History Narrative  . Not on  file   Social Determinants of Health   Financial Resource Strain: Not on file  Food Insecurity: Not on file  Transportation Needs: Not on file  Physical Activity: Not on file  Stress: Not on file  Social Connections: Not on file  Intimate Partner Violence: Not on file   Family History  Problem Relation Age of Onset  . Diabetes Mother   . Heart attack Father   . Colon cancer Neg Hx   . Esophageal cancer Neg Hx   . Liver cancer Neg Hx   . Pancreatic cancer Neg Hx   . Rectal cancer Neg Hx   . Stomach cancer Neg Hx     OBJECTIVE:  Vitals:   11/08/20 0825  BP: 133/72  Pulse: 70  Resp: 18  Temp: 98.4 F (36.9 C)  TempSrc: Oral  SpO2: 96%     Physical Exam Vitals and nursing note reviewed.  Constitutional:      General: He  is not in acute distress.    Appearance: Normal appearance. He is normal weight. He is not ill-appearing, toxic-appearing or diaphoretic.  HENT:     Right Ear: Ear canal and external ear normal. There is impacted cerumen.     Left Ear: Ear canal and external ear normal. A middle ear effusion is present. There is no impacted cerumen.     Ears:     Comments: Post ear lavage: Right TM with middle ear effusion, no erythema Cardiovascular:     Rate and Rhythm: Normal rate and regular rhythm.     Pulses: Normal pulses.     Heart sounds: Normal heart sounds. No murmur heard. No friction rub. No gallop.   Pulmonary:     Effort: Pulmonary effort is normal. No respiratory distress.     Breath sounds: Normal breath sounds. No stridor. No wheezing, rhonchi or rales.  Chest:     Chest wall: No tenderness.  Neurological:     Mental Status: He is alert and oriented to person, place, and time.     Imaging: No results found.   ASSESSMENT & PLAN:  1. Impacted cerumen of right ear   2. Middle ear effusion, bilateral     Meds ordered this encounter  Medications  . fluticasone (FLONASE) 50 MCG/ACT nasal spray    Sig: Place 1 spray into both nostrils daily for 14 days.    Dispense:  16 g    Refill:  0     Discharge Instructions  Rest and drink plenty of fluids Prescribed Flonase Use OTC Debrox (Carbamide peroxide) to clean ear Take medications as directed and to completion Continue to use OTC ibuprofen and/ or tylenol as needed for pain control Follow up with PCP if symptoms persists Return here or go to the ER if you have any new or worsening symptoms   Reviewed expectations re: course of current medical issues. Questions answered. Outlined signs and symptoms indicating need for more acute intervention. Patient verbalized understanding. After Visit Summary given.         Emerson Monte, [place] 11/08/20 [phone]

## 2020-11-08 NOTE — ED Triage Notes (Signed)
Right ear stopped up.  Has been using ear drops with no relief.

## 2020-11-08 NOTE — Discharge Instructions (Addendum)
Rest and drink plenty of fluids Prescribed Flonase Use OTC Debrox (Carbamide peroxide) to clean ear Take medications as directed and to completion Continue to use OTC ibuprofen and/ or tylenol as needed for pain control Follow up with PCP if symptoms persists Return here or go to the ER if you have any new or worsening symptoms

## 2020-11-16 ENCOUNTER — Emergency Department (HOSPITAL_COMMUNITY)
Admission: EM | Admit: 2020-11-16 | Discharge: 2020-11-17 | Disposition: A | Discharge status: Home / Self Care | Payer: Medicare Other | Attending: Emergency Medicine | Admitting: Emergency Medicine

## 2020-11-16 ENCOUNTER — Emergency Department (HOSPITAL_COMMUNITY): Payer: Medicare Other

## 2020-11-16 ENCOUNTER — Other Ambulatory Visit: Payer: Self-pay

## 2020-11-16 ENCOUNTER — Encounter (HOSPITAL_COMMUNITY): Payer: Self-pay

## 2020-11-16 DIAGNOSIS — E119 Type 2 diabetes mellitus without complications: Secondary | ICD-10-CM | POA: Diagnosis not present

## 2020-11-16 DIAGNOSIS — R1084 Generalized abdominal pain: Secondary | ICD-10-CM | POA: Diagnosis not present

## 2020-11-16 DIAGNOSIS — Z79899 Other long term (current) drug therapy: Secondary | ICD-10-CM | POA: Diagnosis not present

## 2020-11-16 DIAGNOSIS — Z7982 Long term (current) use of aspirin: Secondary | ICD-10-CM | POA: Diagnosis not present

## 2020-11-16 DIAGNOSIS — Z20822 Contact with and (suspected) exposure to covid-19: Secondary | ICD-10-CM | POA: Diagnosis not present

## 2020-11-16 DIAGNOSIS — Z7984 Long term (current) use of oral hypoglycemic drugs: Secondary | ICD-10-CM | POA: Diagnosis not present

## 2020-11-16 DIAGNOSIS — R509 Fever, unspecified: Secondary | ICD-10-CM | POA: Diagnosis not present

## 2020-11-16 DIAGNOSIS — R1110 Vomiting, unspecified: Secondary | ICD-10-CM | POA: Insufficient documentation

## 2020-11-16 DIAGNOSIS — R079 Chest pain, unspecified: Secondary | ICD-10-CM | POA: Diagnosis not present

## 2020-11-16 DIAGNOSIS — I10 Essential (primary) hypertension: Secondary | ICD-10-CM | POA: Diagnosis not present

## 2020-11-16 DIAGNOSIS — K529 Noninfective gastroenteritis and colitis, unspecified: Secondary | ICD-10-CM | POA: Diagnosis not present

## 2020-11-16 DIAGNOSIS — R0789 Other chest pain: Secondary | ICD-10-CM | POA: Diagnosis not present

## 2020-11-16 DIAGNOSIS — K219 Gastro-esophageal reflux disease without esophagitis: Secondary | ICD-10-CM | POA: Insufficient documentation

## 2020-11-16 DIAGNOSIS — R000 Tachycardia, unspecified: Secondary | ICD-10-CM | POA: Diagnosis not present

## 2020-11-16 LAB — CBC WITH DIFFERENTIAL/PLATELET
Abs Immature Granulocytes: 0.03 10*3/uL (ref 0.00–0.07)
Basophils Absolute: 0 10*3/uL (ref 0.0–0.1)
Basophils Relative: 0 %
Eosinophils Absolute: 0 10*3/uL (ref 0.0–0.5)
Eosinophils Relative: 0 %
HCT: 49.2 % (ref 39.0–52.0)
Hemoglobin: 16.5 g/dL (ref 13.0–17.0)
Immature Granulocytes: 0 %
Lymphocytes Relative: 2 %
Lymphs Abs: 0.2 10*3/uL — ABNORMAL LOW (ref 0.7–4.0)
MCH: 30.4 pg (ref 26.0–34.0)
MCHC: 33.5 g/dL (ref 30.0–36.0)
MCV: 90.8 fL (ref 80.0–100.0)
Monocytes Absolute: 0.6 10*3/uL (ref 0.1–1.0)
Monocytes Relative: 6 %
Neutro Abs: 8.8 10*3/uL — ABNORMAL HIGH (ref 1.7–7.7)
Neutrophils Relative %: 92 %
Platelets: 208 10*3/uL (ref 150–400)
RBC: 5.42 MIL/uL (ref 4.22–5.81)
RDW: 12.6 % (ref 11.5–15.5)
WBC: 9.6 10*3/uL (ref 4.0–10.5)
nRBC: 0 % (ref 0.0–0.2)

## 2020-11-16 LAB — COMPREHENSIVE METABOLIC PANEL
ALT: 32 U/L (ref 0–44)
AST: 22 U/L (ref 15–41)
Albumin: 4.3 g/dL (ref 3.5–5.0)
Alkaline Phosphatase: 46 U/L (ref 38–126)
Anion gap: 13 (ref 5–15)
BUN: 20 mg/dL (ref 8–23)
CO2: 18 mmol/L — ABNORMAL LOW (ref 22–32)
Calcium: 8.9 mg/dL (ref 8.9–10.3)
Chloride: 108 mmol/L (ref 98–111)
Creatinine, Ser: 0.89 mg/dL (ref 0.61–1.24)
GFR, Estimated: >60 mL/min (ref >60)
Glucose, Bld: 228 mg/dL — ABNORMAL HIGH (ref 70–99)
Potassium: 4.3 mmol/L (ref 3.5–5.1)
Sodium: 139 mmol/L (ref 135–145)
Total Bilirubin: 0.6 mg/dL (ref 0.3–1.2)
Total Protein: 7.5 g/dL (ref 6.5–8.1)

## 2020-11-16 LAB — RESP PANEL BY RT-PCR (FLU A&B, COVID) ARPGX2
Influenza A by PCR: NEGATIVE
Influenza B by PCR: NEGATIVE
SARS Coronavirus 2 by RT PCR: NEGATIVE

## 2020-11-16 LAB — TROPONIN I (HIGH SENSITIVITY): Troponin I (High Sensitivity): 3 ng/L (ref <18)

## 2020-11-16 MED ORDER — SODIUM CHLORIDE 0.9 % IV BOLUS (SEPSIS)
1000.0000 mL | Freq: Once | INTRAVENOUS | Status: AC
  Administered 2020-11-16: 1000 mL via INTRAVENOUS

## 2020-11-16 NOTE — ED Provider Notes (Signed)
[facility] EMERGENCY DEPARTMENT Provider Note   CSN: [id number] Arrival date & time: 11/16/20  2117     History Chief Complaint  Patient presents with  . Chest Pain    Edward Armstrong. is a 70 y.o. male.  Patient with abdominal pain and vomiting.  He also had some chest discomfort  The history is provided by the patient and medical records. No language interpreter was used.  Abdominal Pain Pain location:  Generalized Pain quality: aching   Pain radiates to:  Does not radiate Pain severity:  Mild Onset quality:  Sudden Timing:  Constant Progression:  Worsening Chronicity:  New Context: not alcohol use   Associated symptoms: no chest pain, no cough, no diarrhea, no fatigue and no hematuria        Past Medical History:  Diagnosis Date  . Allergy   . Anxiety   . Depression   . Diabetes mellitus without complication ([place])   . GERD (gastroesophageal reflux disease)   . Hyperlipidemia   . Hypertension   . Pre-diabetes   . SVT (supraventricular tachycardia) [facility])     Patient Active Problem List   Diagnosis Date Noted  . SVT (supraventricular tachycardia) ([place]) 06/10/2020  . Palpitations 06/10/2020  . Chest pressure 06/10/2020  . Essential hypertension 06/10/2020  . Hyperlipidemia 06/10/2020  . Diabetes mellitus type 2, noninsulin dependent ([place]) 06/10/2020  . Left shoulder pain 02/21/2013  . Rotator cuff syndrome of left shoulder 02/21/2013    Past Surgical History:  Procedure Laterality Date  . APPENDECTOMY    . SVT ABLATION N/A 08/02/2020   Procedure: SVT ABLATION;  Surgeon: Constance Haw, MD;  Location: [place] [facility];  Service: Cardiovascular;  Laterality: N/A;  . VASECTOMY         Family History  Problem Relation Age of Onset  . Diabetes Mother   . Heart attack Father   . Colon cancer Neg Hx   . Esophageal cancer Neg Hx   . Liver cancer Neg Hx   . Pancreatic cancer Neg Hx   . Rectal cancer Neg Hx   . Stomach cancer Neg Hx      Social History   Tobacco Use  . Smoking status: Never Smoker  . Smokeless tobacco: Never Used  Vaping Use  . Vaping Use: Never used  Substance Use Topics  . Alcohol use: Yes    Comment: Moderately  . Drug use: No    Home Medications Prior to Admission medications   Medication Sig Start Date End Date Taking? Authorizing Provider  acetaminophen (TYLENOL) 500 MG tablet Take 1,000 mg by mouth every 6 (six) hours as needed for mild pain or headache.   Yes [provider]  ALPRAZolam (XANAX) 0.25 MG tablet Take 0.25 mg by mouth in the morning and at bedtime.   Yes [provider]  aspirin EC 81 MG tablet Take 81 mg by mouth daily.   Yes [provider]  diltiazem (CARDIZEM) 30 MG tablet Take 1 tablet (30 mg total) by mouth every 6 (six) hours as needed (for elevated heart rates). 07/12/20  Yes Camnitz, Will Hassell Done, MD  ezetimibe (ZETIA) 10 MG tablet Take 10 mg by mouth at bedtime.    Yes [provider]  fluticasone (FLONASE) 50 MCG/ACT nasal spray Place 1 spray into both nostrils daily for 14 days. 11/08/20 11/22/20 Yes Avegno, Darrelyn Hillock, FNP  menthol-zinc oxide (GOLD BOND) powder Apply 1 application topically 2 (two) times daily.   Yes [provider]  metFORMIN (GLUCOPHAGE) 1000 MG tablet Take 1,000 mg by mouth 2 (two) times daily with a meal.    Yes [provider]  metoprolol succinate (TOPROL-XL) 50 MG 24 hr tablet Take 1 tablet (50 mg total) by mouth daily. Take with or immediately following a meal. 10/09/20 01/07/21 Yes Camnitz, Ocie Doyne, MD  Multiple Vitamins-Minerals (VITAMIN C EFFERVESCENT BLEND PO) Take 1 packet by mouth daily.   Yes [provider]  omeprazole (PRILOSEC) 40 MG capsule Take 40 mg by mouth daily.   Yes [provider]  Propylene Glycol (SYSTANE COMPLETE OP) Place 1 drop into both eyes in the morning and at bedtime.   Yes [provider]    Allergies    Macrodantin [nitrofurantoin  macrocrystal], Clindamycin/lincomycin, and Septra [sulfamethoxazole-trimethoprim]  Review of Systems   Review of Systems  Constitutional: Negative for appetite change and fatigue.  HENT: Negative for congestion, ear discharge and sinus pressure.   Eyes: Negative for discharge.  Respiratory: Negative for cough.   Cardiovascular: Negative for chest pain.  Gastrointestinal: Positive for abdominal pain. Negative for diarrhea.  Genitourinary: Negative for frequency and hematuria.  Musculoskeletal: Negative for back pain.  Skin: Negative for rash.  Neurological: Negative for seizures and headaches.  Psychiatric/Behavioral: Negative for hallucinations.    Physical Exam Updated Vital Signs BP 140/70   Pulse (!) 108   Temp (!) 100.7 F (38.2 C) (Oral)   Resp (!) 22   Ht 5\' 11"  (1.803 m)   Wt 93 kg   SpO2 95%   BMI 28.59 kg/m   Physical Exam Vitals and nursing note reviewed.  Constitutional:      Appearance: He is well-developed.  HENT:     Head: Normocephalic.     Nose: Nose normal.  Eyes:     General: No scleral icterus.    Conjunctiva/sclera: Conjunctivae normal.  Neck:     Thyroid: No thyromegaly.  Cardiovascular:     Rate and Rhythm: Normal rate and regular rhythm.     Heart sounds: No murmur heard. No friction rub. No gallop.   Pulmonary:     Breath sounds: No stridor. No wheezing or rales.  Chest:     Chest wall: No tenderness.  Abdominal:     General: There is no distension.     Tenderness: There is abdominal tenderness. There is no rebound.     Comments: Tender left lower quadrant  Musculoskeletal:        General: Normal range of motion.     Cervical back: Neck supple.  Lymphadenopathy:     Cervical: No cervical adenopathy.  Skin:    Findings: No erythema or rash.  Neurological:     Mental Status: He is alert and oriented to person, place, and time.     Motor: No abnormal muscle tone.     Coordination: Coordination normal.  Psychiatric:         Behavior: Behavior normal.     ED Results / Procedures / Treatments   Labs (all labs ordered are listed, but only abnormal results are displayed) Labs Reviewed  CBC WITH DIFFERENTIAL/PLATELET - Abnormal; Notable for the following components:      Result Value   Neutro Abs 8.8 (*)    Lymphs Abs 0.2 (*)    All other components within normal limits  RESP PANEL BY RT-PCR (FLU A&B, COVID) ARPGX2  COMPREHENSIVE METABOLIC PANEL  URINALYSIS, ROUTINE W REFLEX MICROSCOPIC  TROPONIN I (HIGH SENSITIVITY)    EKG EKG Interpretation  Date/Time:  Saturday November 16 2020 21:23:12 EDT Ventricular Rate:  131 PR Interval:    QRS Duration: 93 QT Interval:  288 QTC Calculation: 426 R Axis:   63 Text Interpretation: Sinus tachycardia Abnormal T, consider ischemia, inferior leads Confirmed by Milton Ferguson [phone]) on 11/16/2020 9:40:39 PM   Radiology DG Chest Port 1 View  Result Date: 11/16/2020 CLINICAL DATA:  Chest pain EXAM: PORTABLE CHEST 1 VIEW COMPARISON:  06/10/2020 FINDINGS: Heart and mediastinal contours are within normal limits. No focal opacities or effusions. No acute bony abnormality. IMPRESSION: No active disease. Electronically Signed   By: Rolm Baptise M.D.   On: 11/16/2020 22:30    Procedures Procedures   Medications Ordered in ED Medications  sodium chloride 0.9 % bolus 1,000 mL (1,000 mLs Intravenous New Bag/Given 11/16/20 2153)    ED Course  I have reviewed the triage vital signs and the nursing notes.  Pertinent labs & imaging results that were available during my care of the patient were reviewed by me and considered in my medical decision making (see chart for details).    MDM Rules/Calculators/A&P                          Patient with abdominal pain.  CT scan abdomen pending Final Clinical Impression(s) / ED Diagnoses Final diagnoses:  None    Rx / DC Orders ED Discharge Orders    None       Milton Ferguson, MD 11/18/20 1141

## 2020-11-16 NOTE — ED Triage Notes (Signed)
Pt to er, pt states that his stomach was bothering him earlier today, states that he felt bloated and then started vomiting, states that as the day progressed he started having an elevated heart rate and then some chest pain.  EMS states that pt is here for chest pain, found in a chair with some diaphoresis, blood sugar 237.  Pt has indwelling 20g iv in his R hand. States that he was given 4 zofran, 324 aspirin and one ntg.

## 2020-11-17 ENCOUNTER — Emergency Department (HOSPITAL_COMMUNITY): Payer: Medicare Other

## 2020-11-17 DIAGNOSIS — K529 Noninfective gastroenteritis and colitis, unspecified: Secondary | ICD-10-CM | POA: Diagnosis not present

## 2020-11-17 DIAGNOSIS — R1084 Generalized abdominal pain: Secondary | ICD-10-CM | POA: Diagnosis not present

## 2020-11-17 LAB — URINALYSIS, ROUTINE W REFLEX MICROSCOPIC
Bacteria, UA: NONE SEEN
Bilirubin Urine: NEGATIVE
Glucose, UA: >=500 mg/dL — AB
Ketones, ur: 80 mg/dL — AB
Leukocytes,Ua: NEGATIVE
Nitrite: NEGATIVE
Protein, ur: NEGATIVE mg/dL
Specific Gravity, Urine: 1.02 (ref 1.005–1.030)
pH: 5 (ref 5.0–8.0)

## 2020-11-17 LAB — TROPONIN I (HIGH SENSITIVITY): Troponin I (High Sensitivity): 3 ng/L (ref <18)

## 2020-11-17 MED ORDER — IOHEXOL 300 MG/ML  SOLN
100.0000 mL | Freq: Once | INTRAMUSCULAR | Status: AC | PRN
  Administered 2020-11-17: 100 mL via INTRAVENOUS

## 2020-11-17 NOTE — ED Provider Notes (Signed)
Care assumed from Dr. Roderic Palau at shift change.  He is awaiting results of a CT scan of the abdomen and pelvis.  Patient is a 70 year old male with history of SVT presenting here with complaints of nausea, vomiting, and feeling generally unwell that began while attending a [employer] with his grandchild.  He reports several episodes of nausea and vomiting, then he became weak and developed some tightness in his chest.  He then presented for evaluation.  Thus far, his work-up has been unremarkable including EKG and troponin x2.  His CT scan has finally returned and is negative for acute process.  There are findings consistent with gastroenteritis/mesenteric adenitis, but no acute surgical findings.  At this point, patient seems appropriate for discharge.  He has had a slight tachycardia since he has been here, however it has not had his beta-blocker this evening and I believe this to be the reason.  Patient lives very close by and is comfortable with returning home.  If he develops any additional problems, he will return for reevaluation.   Veryl Speak, MD 11/17/20 [phone]

## 2020-11-17 NOTE — Discharge Instructions (Addendum)
Continue medications as previously prescribed.  Return to the emergency department if you develop worsening chest or abdominal pain, difficulty breathing, bloody stools, or other new and concerning symptoms.

## 2020-11-17 NOTE — ED Notes (Signed)
Pt returned from CT Scan 

## 2020-11-20 DIAGNOSIS — M65332 Trigger finger, left middle finger: Secondary | ICD-10-CM | POA: Diagnosis not present

## 2020-12-03 ENCOUNTER — Other Ambulatory Visit: Payer: Self-pay | Admitting: Cardiology

## 2020-12-18 ENCOUNTER — Ambulatory Visit (INDEPENDENT_AMBULATORY_CARE_PROVIDER_SITE_OTHER): Payer: Medicare Other | Admitting: Cardiology

## 2020-12-18 ENCOUNTER — Other Ambulatory Visit: Payer: Self-pay

## 2020-12-18 ENCOUNTER — Encounter: Payer: Self-pay | Admitting: Cardiology

## 2020-12-18 VITALS — BP 122/80 | HR 63 | Ht 71.0 in | Wt 209.0 lb

## 2020-12-18 DIAGNOSIS — I471 Supraventricular tachycardia: Secondary | ICD-10-CM | POA: Diagnosis not present

## 2020-12-18 NOTE — Progress Notes (Signed)
Electrophysiology Office Note   Date:  12/18/2020   ID:  [name]., DOB 09-Jun-1951, MRN [id number]  PCP:  Asencion Noble, MD  Cardiologist:  Domenic Polite Primary Electrophysiologist:  Taray Normoyle Meredith Leeds, MD    Chief Complaint: SVT   History of Present Illness: Edward Armstrong. is a 70 y.o. male who is being seen today for the evaluation of SVT at the request of Asencion Noble, MD. Presenting today for electrophysiology evaluation.  She has a history significant for hypertension, non-insulin-dependent diabetes, and SVT.  She wore a cardiac monitor that showed sinus rhythm with rare ectopy.  On 06/10/2021 he developed rapid tachycardia with chest pain.  He presented to the emergency room with a heart rate of 173 that terminated spontaneously.  He was started on diltiazem.  He had an attempted ablation.  During EP study, he has short episodes of tachycardia that occurred during pacing, but would stop as pacing was stopped.  No tachycardia testing was performed.  His diltiazem was increased.  He continued to have palpitations and was switched to Toprol-XL.  He wore a cardiac monitor that showed no significant arrhythmias.  Today, denies symptoms of palpitations, chest pain, shortness of breath, orthopnea, PND, lower extremity edema, claudication, dizziness, presyncope, syncope, bleeding, or neurologic sequela. The patient is tolerating medications without difficulties.  Since last being seen he has done well.  He has minimal palpitations that last for a few seconds at a time.  They mainly occur in the evenings.  Aside from that, he has no major complaints.  He is able to exert himself without issue.    Past Medical History:  Diagnosis Date  . Allergy   . Anxiety   . Depression   . Diabetes mellitus without complication ([place])   . GERD (gastroesophageal reflux disease)   . Hyperlipidemia   . Hypertension   . Pre-diabetes   . SVT (supraventricular tachycardia) (HCC)    Past Surgical  History:  Procedure Laterality Date  . APPENDECTOMY    . SVT ABLATION N/A 08/02/2020   Procedure: SVT ABLATION;  Surgeon: Constance Haw, MD;  Location: [facility];  Service: Cardiovascular;  Laterality: N/A;  . VASECTOMY       Current Outpatient Medications  Medication Sig Dispense Refill  . acetaminophen (TYLENOL) 500 MG tablet Take 1,000 mg by mouth every 6 (six) hours as needed for mild pain or headache.    . ALPRAZolam (XANAX) 0.25 MG tablet Take 0.25 mg by mouth in the morning and at bedtime.    Marland Kitchen aspirin EC 81 MG tablet Take 81 mg by mouth daily.    Marland Kitchen diltiazem (CARDIZEM) 30 MG tablet Take 1 tablet (30 mg total) by mouth every 6 (six) hours as needed (for elevated heart rates). 30 tablet 1  . ezetimibe (ZETIA) 10 MG tablet Take 10 mg by mouth at bedtime.     . fluticasone (FLONASE) 50 MCG/ACT nasal spray Place 1 spray into both nostrils daily for 14 days. 16 g 0  . menthol-zinc oxide (GOLD BOND) powder Apply 1 application topically 2 (two) times daily.    . metFORMIN (GLUCOPHAGE) 1000 MG tablet Take 1,000 mg by mouth 2 (two) times daily with a meal.     . metoprolol succinate (TOPROL-XL) 50 MG 24 hr tablet Take 1 tablet (50 mg total) by mouth daily. Take with or immediately following a meal. 90 tablet 1  . Multiple Vitamins-Minerals (VITAMIN C EFFERVESCENT BLEND PO) Take 1 packet by  mouth daily.    Marland Kitchen omeprazole (PRILOSEC) 40 MG capsule Take 40 mg by mouth daily.    Marland Kitchen Propylene Glycol (SYSTANE COMPLETE OP) Place 1 drop into both eyes in the morning and at bedtime.     No current facility-administered medications for this visit.    Allergies:   Macrodantin [nitrofurantoin macrocrystal], Clindamycin/lincomycin, and Septra [sulfamethoxazole-trimethoprim]   Social History:  The patient  reports that he has never smoked. He has never used smokeless tobacco. He reports current alcohol use. He reports that he does not use drugs.   Family History:  The patient's family  history includes Diabetes in his mother; Heart attack in his father.   ROS:  Please see the history of present illness.   Otherwise, review of systems is positive for none.   All other systems are reviewed and negative.   PHYSICAL EXAM: VS:  BP 122/80 (BP Location: Left Arm, Patient Position: Sitting, Cuff Size: Normal)   Pulse 63   Ht 5\' 11"  (1.803 m)   Wt 209 lb (94.8 kg)   SpO2 96%   BMI 29.15 kg/m  , BMI Body mass index is 29.15 kg/m. GEN: Well nourished, well developed, in no acute distress  HEENT: normal  Neck: no JVD, carotid bruits, or masses Cardiac: RRR; no murmurs, rubs, or gallops,no edema  Respiratory:  clear to auscultation bilaterally, normal work of breathing GI: soft, nontender, nondistended, + BS MS: no deformity or atrophy  Skin: warm and dry Neuro:  Strength and sensation are intact Psych: euthymic mood, full affect  EKG:  EKG is ordered today. Personal review of the ekg ordered shows sinus rhythm, rate 63  Recent Labs: 06/10/2020: Magnesium 1.9; TSH 1.701 11/16/2020: ALT 32; BUN 20; Creatinine, Ser 0.89; Hemoglobin 16.5; Platelets 208; Potassium 4.3; Sodium 139    Lipid Panel  No results found for: CHOL, TRIG, HDL, CHOLHDL, VLDL, LDLCALC, LDLDIRECT   Wt Readings from Last 3 Encounters:  12/18/20 209 lb (94.8 kg)  11/16/20 205 lb (93 kg)  09/09/20 205 lb 9.6 oz (93.3 kg)      Other studies Reviewed: Additional studies/ records that were reviewed today include: TTE 06/11/20  Review of the above records today demonstrates:  1. Left ventricular ejection fraction, by estimation, is 55 to 60%. The  left ventricle has normal function. The left ventricle has no regional  wall motion abnormalities. There is mild left ventricular hypertrophy.  Left ventricular diastolic parameters  are indeterminate.  2. Right ventricular systolic function is normal. The right ventricular  size is normal. Tricuspid regurgitation signal is inadequate for assessing  PA  pressure.  3. The mitral valve is grossly normal. Trivial mitral valve  regurgitation.  4. The aortic valve is tricuspid. Aortic valve regurgitation is not  visualized.  5. The inferior vena cava is normal in size with greater than 50%  respiratory variability, suggesting right atrial pressure of 3 mmHg.  Holter monitor 09/30/2020 personally reviewed  Predominant rhythm is normal sinus rhythm  Rare supraventricular and ventricular ectopy  Reported symptoms correlated with sinus rhythm  No significant arrhythmias   ASSESSMENT AND PLAN:  1.  SVT: EP study 08/29/2020 with multiple episodes of SVT that occurred during pacing.  They would all terminate post pacing.  He has been started on Toprol-XL.  Cardiac monitor 09/30/2020 showed no significant arrhythmias.  He continues to have short-lived palpitations.  They last a few seconds at a time.  Otherwise he is doing well and is without complaint.  2.  Hypertension: Currently well controlled  3.  Hyperlipidemia: Continue Zetia per PCP  Current medicines are reviewed at length with the patient today.   The patient does not have concerns regarding his medicines.  The following changes were made today: None  Labs/ tests ordered today include:  Orders Placed This Encounter  Procedures  . EKG 12-Lead     Disposition:   FU with Christella App 12 months  Signed, Kinga Cassar Meredith Leeds, MD  12/18/2020 11:25 AM     [facility] [address] [place] [address] [phone] (office) [phone] (fax)

## 2020-12-27 DIAGNOSIS — M9903 Segmental and somatic dysfunction of lumbar region: Secondary | ICD-10-CM | POA: Diagnosis not present

## 2020-12-27 DIAGNOSIS — M9902 Segmental and somatic dysfunction of thoracic region: Secondary | ICD-10-CM | POA: Diagnosis not present

## 2020-12-27 DIAGNOSIS — M546 Pain in thoracic spine: Secondary | ICD-10-CM | POA: Diagnosis not present

## 2021-01-01 DIAGNOSIS — M9903 Segmental and somatic dysfunction of lumbar region: Secondary | ICD-10-CM | POA: Diagnosis not present

## 2021-01-01 DIAGNOSIS — M546 Pain in thoracic spine: Secondary | ICD-10-CM | POA: Diagnosis not present

## 2021-01-01 DIAGNOSIS — M9902 Segmental and somatic dysfunction of thoracic region: Secondary | ICD-10-CM | POA: Diagnosis not present

## 2021-01-02 DIAGNOSIS — M5400 Panniculitis affecting regions of neck and back, site unspecified: Secondary | ICD-10-CM | POA: Diagnosis not present

## 2021-01-02 DIAGNOSIS — N390 Urinary tract infection, site not specified: Secondary | ICD-10-CM | POA: Diagnosis not present

## 2021-01-03 DIAGNOSIS — M9902 Segmental and somatic dysfunction of thoracic region: Secondary | ICD-10-CM | POA: Diagnosis not present

## 2021-01-03 DIAGNOSIS — M9903 Segmental and somatic dysfunction of lumbar region: Secondary | ICD-10-CM | POA: Diagnosis not present

## 2021-01-03 DIAGNOSIS — M546 Pain in thoracic spine: Secondary | ICD-10-CM | POA: Diagnosis not present

## 2021-01-10 DIAGNOSIS — E1159 Type 2 diabetes mellitus with other circulatory complications: Secondary | ICD-10-CM | POA: Diagnosis not present

## 2021-01-10 DIAGNOSIS — M9902 Segmental and somatic dysfunction of thoracic region: Secondary | ICD-10-CM | POA: Diagnosis not present

## 2021-01-10 DIAGNOSIS — Z79899 Other long term (current) drug therapy: Secondary | ICD-10-CM | POA: Diagnosis not present

## 2021-01-10 DIAGNOSIS — M546 Pain in thoracic spine: Secondary | ICD-10-CM | POA: Diagnosis not present

## 2021-01-10 DIAGNOSIS — I10 Essential (primary) hypertension: Secondary | ICD-10-CM | POA: Diagnosis not present

## 2021-01-10 DIAGNOSIS — M9903 Segmental and somatic dysfunction of lumbar region: Secondary | ICD-10-CM | POA: Diagnosis not present

## 2021-01-10 DIAGNOSIS — K219 Gastro-esophageal reflux disease without esophagitis: Secondary | ICD-10-CM | POA: Diagnosis not present

## 2021-01-10 DIAGNOSIS — E785 Hyperlipidemia, unspecified: Secondary | ICD-10-CM | POA: Diagnosis not present

## 2021-01-10 DIAGNOSIS — Z125 Encounter for screening for malignant neoplasm of prostate: Secondary | ICD-10-CM | POA: Diagnosis not present

## 2021-01-10 DIAGNOSIS — F419 Anxiety disorder, unspecified: Secondary | ICD-10-CM | POA: Diagnosis not present

## 2021-01-17 DIAGNOSIS — I10 Essential (primary) hypertension: Secondary | ICD-10-CM | POA: Diagnosis not present

## 2021-01-17 DIAGNOSIS — R7309 Other abnormal glucose: Secondary | ICD-10-CM | POA: Diagnosis not present

## 2021-01-17 DIAGNOSIS — E785 Hyperlipidemia, unspecified: Secondary | ICD-10-CM | POA: Diagnosis not present

## 2021-01-17 DIAGNOSIS — E1169 Type 2 diabetes mellitus with other specified complication: Secondary | ICD-10-CM | POA: Diagnosis not present

## 2021-01-17 DIAGNOSIS — I471 Supraventricular tachycardia: Secondary | ICD-10-CM | POA: Diagnosis not present

## 2021-01-20 DIAGNOSIS — M65332 Trigger finger, left middle finger: Secondary | ICD-10-CM | POA: Diagnosis not present

## 2021-01-20 DIAGNOSIS — M720 Palmar fascial fibromatosis [Dupuytren]: Secondary | ICD-10-CM | POA: Diagnosis not present

## 2021-01-20 DIAGNOSIS — G5603 Carpal tunnel syndrome, bilateral upper limbs: Secondary | ICD-10-CM | POA: Diagnosis not present

## 2021-01-24 DIAGNOSIS — M9902 Segmental and somatic dysfunction of thoracic region: Secondary | ICD-10-CM | POA: Diagnosis not present

## 2021-01-24 DIAGNOSIS — M9903 Segmental and somatic dysfunction of lumbar region: Secondary | ICD-10-CM | POA: Diagnosis not present

## 2021-01-24 DIAGNOSIS — M546 Pain in thoracic spine: Secondary | ICD-10-CM | POA: Diagnosis not present

## 2021-01-28 MED ORDER — METOPROLOL SUCCINATE ER 50 MG PO TB24: 50.0000 mg | ORAL_TABLET | Freq: Every day | ORAL | 3 refills | Status: DC

## 2021-01-31 DIAGNOSIS — M9903 Segmental and somatic dysfunction of lumbar region: Secondary | ICD-10-CM | POA: Diagnosis not present

## 2021-01-31 DIAGNOSIS — M9902 Segmental and somatic dysfunction of thoracic region: Secondary | ICD-10-CM | POA: Diagnosis not present

## 2021-01-31 DIAGNOSIS — M546 Pain in thoracic spine: Secondary | ICD-10-CM | POA: Diagnosis not present

## 2021-02-02 ENCOUNTER — Other Ambulatory Visit: Payer: Self-pay | Admitting: Cardiology

## 2021-02-04 ENCOUNTER — Other Ambulatory Visit: Payer: Self-pay

## 2021-02-04 ENCOUNTER — Ambulatory Visit (INDEPENDENT_AMBULATORY_CARE_PROVIDER_SITE_OTHER): Payer: Medicare Other | Admitting: Dermatology

## 2021-02-04 ENCOUNTER — Encounter: Payer: Self-pay | Admitting: Dermatology

## 2021-02-04 DIAGNOSIS — L720 Epidermal cyst: Secondary | ICD-10-CM

## 2021-02-04 DIAGNOSIS — D0439 Carcinoma in situ of skin of other parts of face: Secondary | ICD-10-CM | POA: Diagnosis not present

## 2021-02-04 DIAGNOSIS — L821 Other seborrheic keratosis: Secondary | ICD-10-CM | POA: Diagnosis not present

## 2021-02-04 DIAGNOSIS — D485 Neoplasm of uncertain behavior of skin: Secondary | ICD-10-CM

## 2021-02-04 NOTE — Patient Instructions (Signed)

## 2021-02-09 ENCOUNTER — Encounter: Payer: Self-pay | Admitting: Dermatology

## 2021-02-09 NOTE — Progress Notes (Signed)
   Follow-Up Visit   Subjective  Edward Armstrong. is a 71 y.o. male who presents for the following: Skin Problem (Patient here today for recurrent lesion on left temple that was previously LN2 in November, per patient he was told that if the lesion returned then he was to return to the clinic. No bleeding, no pain, just itching. ).  Freeze failure crust left cheekbone plus check several other spots Location:  Duration:  Quality:  Associated Signs/Symptoms: Modifying Factors:  Severity:  Timing: Context:   Objective  Well appearing patient in no apparent distress; mood and affect are within normal limits. Right Eyebrow Four millimeter white superficial dermal papule, noninflamed  Head - Anterior (Face) Tan 5 mm slightly textured papule  Left Zygomatic Area 1.2 cm  pink flat crust; rule out CIS       All skin waist up examined.   Assessment & Plan    Epidermal inclusion cyst Right Eyebrow  No intervention necessary  Seborrheic keratosis Head - Anterior (Face)  Leave if stable  Neoplasm of uncertain behavior of skin Left Zygomatic Area  Skin / nail biopsy Type of biopsy: tangential   Informed consent: discussed and consent obtained   Timeout: patient name, date of birth, surgical site, and procedure verified   Procedure prep:  Patient was prepped and draped in usual sterile fashion Prep type:  Isopropyl alcohol Anesthesia: the lesion was anesthetized in a standard fashion   Anesthetic:  1% lidocaine w/ epinephrine 1-100,000 buffered w/ 8.4% NaHCO3 Instrument used: flexible razor blade   Hemostasis achieved with: pressure, aluminum chloride and electrodesiccation   Outcome: patient tolerated procedure well   Post-procedure details: sterile dressing applied and wound care instructions given   Dressing type: bandage and petrolatum    Destruction of lesion Complexity: extensive   Destruction method: electrodesiccation and curettage   Informed consent:  discussed and consent obtained   Timeout:  patient name, date of birth, surgical site, and procedure verified Procedure prep:  Patient was prepped and draped in usual sterile fashion Prep type:  Isopropyl alcohol Anesthesia: the lesion was anesthetized in a standard fashion   Anesthetic:  1% lidocaine w/ epinephrine 1-100,000 buffered w/ 8.4% NaHCO3 Curettage performed in three different directions: Yes   Electrodesiccation performed over the curetted area: Yes   Hemostasis achieved with:  pressure and aluminum chloride Outcome: patient tolerated procedure well with no complications   Post-procedure details: sterile dressing applied and wound care instructions given   Dressing type: bandage and petrolatum    Specimen 1 - Surgical pathology Differential Diagnosis: R/O CIS Check Margins: No  After shave biopsy the base of the lesion was treated with curettage plus cautery      I, Lavonna Monarch, MD, have reviewed all documentation for this visit.  The documentation on 02/09/21 for the exam, diagnosis, procedures, and orders are all accurate and complete.

## 2021-02-10 DIAGNOSIS — M720 Palmar fascial fibromatosis [Dupuytren]: Secondary | ICD-10-CM | POA: Diagnosis not present

## 2021-02-10 DIAGNOSIS — Z7984 Long term (current) use of oral hypoglycemic drugs: Secondary | ICD-10-CM | POA: Diagnosis not present

## 2021-02-10 DIAGNOSIS — M65332 Trigger finger, left middle finger: Secondary | ICD-10-CM | POA: Diagnosis not present

## 2021-02-10 DIAGNOSIS — G5621 Lesion of ulnar nerve, right upper limb: Secondary | ICD-10-CM | POA: Diagnosis not present

## 2021-02-10 DIAGNOSIS — G5603 Carpal tunnel syndrome, bilateral upper limbs: Secondary | ICD-10-CM | POA: Diagnosis not present

## 2021-02-10 DIAGNOSIS — E119 Type 2 diabetes mellitus without complications: Secondary | ICD-10-CM | POA: Diagnosis not present

## 2021-02-14 DIAGNOSIS — M9903 Segmental and somatic dysfunction of lumbar region: Secondary | ICD-10-CM | POA: Diagnosis not present

## 2021-02-14 DIAGNOSIS — M546 Pain in thoracic spine: Secondary | ICD-10-CM | POA: Diagnosis not present

## 2021-02-14 DIAGNOSIS — M9902 Segmental and somatic dysfunction of thoracic region: Secondary | ICD-10-CM | POA: Diagnosis not present

## 2021-02-17 DIAGNOSIS — G5621 Lesion of ulnar nerve, right upper limb: Secondary | ICD-10-CM | POA: Diagnosis not present

## 2021-02-17 DIAGNOSIS — G5603 Carpal tunnel syndrome, bilateral upper limbs: Secondary | ICD-10-CM | POA: Diagnosis not present

## 2021-02-18 ENCOUNTER — Other Ambulatory Visit: Payer: Self-pay | Admitting: Orthopedic Surgery

## 2021-02-19 ENCOUNTER — Telehealth: Payer: Self-pay | Admitting: Cardiology

## 2021-02-19 NOTE — Telephone Encounter (Signed)
Left VM

## 2021-02-19 NOTE — Telephone Encounter (Signed)
   [facility] Pre-operative Risk Assessment    Patient Name: Edward Armstrong.  DOB: 1951-07-17  MRN: [id number]   HEARTCARE STAFF: - Please ensure there is not already an duplicate clearance open for this procedure. - Under Visit Info/Reason for Call, type in Other and utilize the format Clearance MM/DD/YY or Clearance TBD. Do not use dashes or single digits. - If request is for dental extraction, please clarify the # of teeth to be extracted. - If the patient is currently at the dentist's office, call Pre-Op APP to address. If the patient is not currently in the dentist office, please route to the Pre-Op pool  Request for surgical clearance:  What type of surgery is being performed? Left carpal tunnel release - release A1 pulley left middle finger   When is this surgery scheduled 04/01/21  What type of clearance is required (medical clearance vs. Pharmacy clearance to hold med vs. Both)? both  Are there any medications that need to be held prior to surgery and how long aspirin  Practice name and name of physician performing surgery? Dr Daryll Brod   What is the office phone number? [phone]    7.   What is the office fax number? [phone]  8.   Anesthesia type (None, local, MAC, general) ? IV Regional Forearm block   Jannet Askew 02/19/2021, 10:49 AM  _________________________________________________________________   (provider comments below)

## 2021-02-20 NOTE — Telephone Encounter (Signed)
    Pt is returning call, pt gave best phone # to call back [phone]

## 2021-02-21 ENCOUNTER — Telehealth: Payer: Self-pay | Admitting: General Practice

## 2021-02-21 NOTE — Telephone Encounter (Signed)
Left voice message to call back 7:11 AM on 02/21/2021.  Patient is call back

## 2021-02-21 NOTE — Telephone Encounter (Signed)
Follow Up:      Pt is returning Edward Armstrong's call from today.

## 2021-02-28 NOTE — Telephone Encounter (Addendum)
   Primary Cardiologist: Rozann Lesches, MD  Chart reviewed as part of pre-operative protocol coverage. Given past medical history and time since last visit, based on ACC/AHA guidelines, Deandrew Hoecker. would be at acceptable risk for the planned procedure without further cardiovascular testing.   His aspirin may be held for 5-7 days prior to his procedure.  Please resume as soon as hemostasis is achieved.  Patient was advised that if he develops new symptoms prior to surgery to contact our office to arrange a follow-up appointment.  He verbalized understanding.  I will route this recommendation to the requesting party via Epic fax function and remove from pre-op pool.  Please call with questions.  Jossie Ng. Vernor Monnig NP-C    02/28/2021, 1:42 PM [facility] [address] Office [phone] Fax [phone]

## 2021-03-10 ENCOUNTER — Ambulatory Visit
Admission: EM | Admit: 2021-03-10 | Discharge: 2021-03-10 | Disposition: A | Discharge status: Home / Self Care | Payer: Medicare Other | Attending: Family Medicine | Admitting: Family Medicine

## 2021-03-10 DIAGNOSIS — L040 Acute lymphadenitis of face, head and neck: Secondary | ICD-10-CM | POA: Diagnosis not present

## 2021-03-10 MED ORDER — AMOXICILLIN-POT CLAVULANATE 875-125 MG PO TABS: 1.0000 | ORAL_TABLET | Freq: Two times a day (BID) | ORAL | 0 refills | Status: DC

## 2021-03-10 NOTE — ED Triage Notes (Signed)
Pt presents with right side neck swelling that is tender to touch

## 2021-03-10 NOTE — ED Provider Notes (Signed)
[facility]    CSN: [id number] Arrival date & time: 03/10/21  0804      History   Chief Complaint Chief Complaint  Patient presents with   Lymphadenopathy    HPI Edward Armstrong. is a 70 y.o. male.   HPI Patient presents for evaluation of right cervical lymph node swelling and pain x 2 days. Patient tested positive for COVID 7/3 and had 24 hours of mild symptoms of scratchy throat and congestion which resolved without intervention. He denies any tooth, throat, or right ear pain at present.  The lymph node has increased in diameter over the last 24 hours and tenderness has increased in intensity.  Denies any fever or any other associated symptoms  Past Medical History:  Diagnosis Date   Allergy    Anxiety    Depression    Diabetes mellitus without complication (HCC)    GERD (gastroesophageal reflux disease)    Hyperlipidemia    Hypertension    Pre-diabetes    SVT (supraventricular tachycardia) (HCC)     Patient Active Problem List   Diagnosis Date Noted   SVT (supraventricular tachycardia) ([place]) 06/10/2020   Palpitations 06/10/2020   Chest pressure 06/10/2020   Essential hypertension 06/10/2020   Hyperlipidemia 06/10/2020   Diabetes mellitus type 2, noninsulin dependent ([place]) 06/10/2020   Left shoulder pain 02/21/2013   Rotator cuff syndrome of left shoulder 02/21/2013    Past Surgical History:  Procedure Laterality Date   APPENDECTOMY     SVT ABLATION N/A 08/02/2020   Procedure: SVT ABLATION;  Surgeon: Constance Haw, MD;  Location: [facility];  Service: Cardiovascular;  Laterality: N/A;   VASECTOMY         Home Medications    Prior to Admission medications   Medication Sig Start Date End Date Taking? Authorizing Provider  acetaminophen (TYLENOL) 500 MG tablet Take 1,000 mg by mouth every 6 (six) hours as needed for mild pain or headache.    [provider]  ALPRAZolam Duanne Moron) 0.25 MG tablet Take 0.25 mg by mouth in  the morning and at bedtime.    [provider]  aspirin EC 81 MG tablet Take 81 mg by mouth daily.    [provider]  diltiazem (CARDIZEM) 30 MG tablet Take 1 tablet (30 mg total) by mouth every 6 (six) hours as needed (for elevated heart rates). 07/12/20   Camnitz, Ocie Doyne, MD  ezetimibe (ZETIA) 10 MG tablet Take 10 mg by mouth at bedtime.     [provider]  fluticasone (FLONASE) 50 MCG/ACT nasal spray Place 1 spray into both nostrils daily for 14 days. 11/08/20 11/22/20  Avegno, Darrelyn Hillock, FNP  menthol-zinc oxide (GOLD BOND) powder Apply 1 application topically 2 (two) times daily.    [provider]  metFORMIN (GLUCOPHAGE) 1000 MG tablet Take 1,000 mg by mouth 2 (two) times daily with a meal.     [provider]  metoprolol succinate (TOPROL-XL) 50 MG 24 hr tablet Take 1 tablet (50 mg total) by mouth daily. Take with or immediately following a meal. 01/28/21   Camnitz, Ocie Doyne, MD  Multiple Vitamins-Minerals (VITAMIN C EFFERVESCENT BLEND PO) Take 1 packet by mouth daily.    [provider]  omeprazole (PRILOSEC) 40 MG capsule Take 40 mg by mouth daily.    [provider]  Propylene Glycol (SYSTANE COMPLETE OP) Place 1 drop into both eyes in the morning and at bedtime.    [provider]  Family History Family History  Problem Relation Age of Onset   Diabetes Mother    Heart attack Father    Colon cancer Neg Hx    Esophageal cancer Neg Hx    Liver cancer Neg Hx    Pancreatic cancer Neg Hx    Rectal cancer Neg Hx    Stomach cancer Neg Hx     Social History Social History   Tobacco Use   Smoking status: Never   Smokeless tobacco: Never  Vaping Use   Vaping Use: Never used  Substance Use Topics   Alcohol use: Yes    Comment: Moderately   Drug use: No     Allergies   Macrodantin [nitrofurantoin macrocrystal], Clindamycin/lincomycin, and Septra [sulfamethoxazole-trimethoprim]   Review of  Systems Review of Systems Pertinent negatives listed in HPI   Physical Exam Triage Vital Signs ED Triage Vitals  Enc Vitals Group     BP 03/10/21 0814 128/74     Pulse Rate 03/10/21 0814 64     Resp 03/10/21 0814 20     Temp 03/10/21 0814 98.1 F (36.7 C)     Temp src --      SpO2 03/10/21 0814 98 %     Weight --      Height --      Head Circumference --      Peak Flow --      Pain Score 03/10/21 0812 6     Pain Loc --      Pain Edu? --      Excl. in [place]? --    No data found.  Updated Vital Signs BP 128/74   Pulse 64   Temp 98.1 F (36.7 C)   Resp 20   SpO2 98%   Visual Acuity Right Eye Distance:   Left Eye Distance:   Bilateral Distance:    Right Eye Near:   Left Eye Near:    Bilateral Near:     Physical Exam  General Appearance:    Alert, cooperative, no distress  HENT:   Normocephalic, ears normal, nares normal, right lateral cervical adenopathy with tenderness, oropharynx within normal limits  Eyes:    PERRL, conjunctiva/corneas clear, EOM's intact       Lungs:     Clear to auscultation bilaterally, respirations unlabored  Heart:    Regular rate and rhythm  Neurologic:   Awake, alert, oriented x 3. No apparent focal neurological           defect.     UC Treatments / Results  Labs (all labs ordered are listed, but only abnormal results are displayed) Labs Reviewed - No data to display  EKG   Radiology No results found.  Procedures Procedures (including critical care time)  Medications Ordered in UC Medications - No data to display  Initial Impression / Assessment and Plan / UC Course  I have reviewed the triage vital signs and the nursing notes.  Pertinent labs & imaging results that were available during my care of the patient were reviewed by me and considered in my medical decision making (see chart for details).     Treated for acute cervical adenitis we will trial a course of Augmentin twice daily for 10 days.  Patient advised if  pain and swelling have not significantly improved within 7 days that I would like for him to follow-up with his primary care doctor for further work-up and evaluation of symptoms.  Also discussed this is likely residual from recent COVID-19  infection as is not uncommon for lymphadenopathy to developed as a post syndrome to recent illness.  Patient is afebrile overall well-appearing. Final Clinical Impressions(s) / UC Diagnoses   Final diagnoses:  Acute cervical adenitis   Discharge Instructions   None    ED Prescriptions     Medication Sig Dispense Auth. Provider   amoxicillin-clavulanate (AUGMENTIN) 875-125 MG tablet Take 1 tablet by mouth 2 (two) times daily. 20 tablet Scot Jun, FNP      PDMP not reviewed this encounter.   Scot Jun, [place] 03/10/21 [id number]

## 2021-03-11 ENCOUNTER — Other Ambulatory Visit: Payer: Self-pay | Admitting: Cardiology

## 2021-03-14 DIAGNOSIS — M546 Pain in thoracic spine: Secondary | ICD-10-CM | POA: Diagnosis not present

## 2021-03-14 DIAGNOSIS — M9903 Segmental and somatic dysfunction of lumbar region: Secondary | ICD-10-CM | POA: Diagnosis not present

## 2021-03-14 DIAGNOSIS — M9902 Segmental and somatic dysfunction of thoracic region: Secondary | ICD-10-CM | POA: Diagnosis not present

## 2021-03-25 ENCOUNTER — Encounter (HOSPITAL_BASED_OUTPATIENT_CLINIC_OR_DEPARTMENT_OTHER): Payer: Self-pay | Admitting: Orthopedic Surgery

## 2021-03-25 ENCOUNTER — Other Ambulatory Visit: Payer: Self-pay

## 2021-03-31 ENCOUNTER — Encounter (HOSPITAL_BASED_OUTPATIENT_CLINIC_OR_DEPARTMENT_OTHER)
Admission: RE | Admit: 2021-03-31 | Discharge: 2021-03-31 | Disposition: A | Discharge status: Home / Self Care | Payer: Medicare Other | Source: Ambulatory Visit | Attending: Orthopedic Surgery | Admitting: Orthopedic Surgery

## 2021-03-31 DIAGNOSIS — Z882 Allergy status to sulfonamides status: Secondary | ICD-10-CM | POA: Diagnosis not present

## 2021-03-31 DIAGNOSIS — Z833 Family history of diabetes mellitus: Secondary | ICD-10-CM | POA: Diagnosis not present

## 2021-03-31 DIAGNOSIS — G5602 Carpal tunnel syndrome, left upper limb: Secondary | ICD-10-CM | POA: Diagnosis not present

## 2021-03-31 DIAGNOSIS — Z7984 Long term (current) use of oral hypoglycemic drugs: Secondary | ICD-10-CM | POA: Diagnosis not present

## 2021-03-31 DIAGNOSIS — Z01812 Encounter for preprocedural laboratory examination: Secondary | ICD-10-CM | POA: Insufficient documentation

## 2021-03-31 DIAGNOSIS — Z881 Allergy status to other antibiotic agents status: Secondary | ICD-10-CM | POA: Diagnosis not present

## 2021-03-31 DIAGNOSIS — M65332 Trigger finger, left middle finger: Secondary | ICD-10-CM | POA: Diagnosis not present

## 2021-03-31 DIAGNOSIS — E119 Type 2 diabetes mellitus without complications: Secondary | ICD-10-CM | POA: Diagnosis not present

## 2021-03-31 DIAGNOSIS — Z8249 Family history of ischemic heart disease and other diseases of the circulatory system: Secondary | ICD-10-CM | POA: Diagnosis not present

## 2021-03-31 LAB — BASIC METABOLIC PANEL
Anion gap: 7 (ref 5–15)
BUN: 15 mg/dL (ref 8–23)
CO2: 24 mmol/L (ref 22–32)
Calcium: 8.8 mg/dL — ABNORMAL LOW (ref 8.9–10.3)
Chloride: 107 mmol/L (ref 98–111)
Creatinine, Ser: 1.08 mg/dL (ref 0.61–1.24)
GFR, Estimated: >60 mL/min (ref >60)
Glucose, Bld: 149 mg/dL — ABNORMAL HIGH (ref 70–99)
Potassium: 4.6 mmol/L (ref 3.5–5.1)
Sodium: 138 mmol/L (ref 135–145)

## 2021-03-31 NOTE — Progress Notes (Signed)

## 2021-04-01 ENCOUNTER — Ambulatory Visit (HOSPITAL_BASED_OUTPATIENT_CLINIC_OR_DEPARTMENT_OTHER)
Admission: RE | Admit: 2021-04-01 | Discharge: 2021-04-01 | Disposition: A | Discharge status: Home / Self Care | Payer: Medicare Other | Attending: Orthopedic Surgery | Admitting: Orthopedic Surgery

## 2021-04-01 ENCOUNTER — Other Ambulatory Visit: Payer: Self-pay

## 2021-04-01 ENCOUNTER — Ambulatory Visit (HOSPITAL_BASED_OUTPATIENT_CLINIC_OR_DEPARTMENT_OTHER): Payer: Medicare Other | Admitting: Anesthesiology

## 2021-04-01 ENCOUNTER — Encounter (HOSPITAL_BASED_OUTPATIENT_CLINIC_OR_DEPARTMENT_OTHER): Payer: Self-pay | Admitting: Orthopedic Surgery

## 2021-04-01 ENCOUNTER — Encounter (HOSPITAL_BASED_OUTPATIENT_CLINIC_OR_DEPARTMENT_OTHER)
Admission: RE | Disposition: A | Discharge status: Home / Self Care | Payer: Self-pay | Source: Home / Self Care | Attending: Orthopedic Surgery

## 2021-04-01 DIAGNOSIS — Z833 Family history of diabetes mellitus: Secondary | ICD-10-CM | POA: Insufficient documentation

## 2021-04-01 DIAGNOSIS — Z8249 Family history of ischemic heart disease and other diseases of the circulatory system: Secondary | ICD-10-CM | POA: Insufficient documentation

## 2021-04-01 DIAGNOSIS — G5602 Carpal tunnel syndrome, left upper limb: Secondary | ICD-10-CM | POA: Diagnosis not present

## 2021-04-01 DIAGNOSIS — Z882 Allergy status to sulfonamides status: Secondary | ICD-10-CM | POA: Insufficient documentation

## 2021-04-01 DIAGNOSIS — E119 Type 2 diabetes mellitus without complications: Secondary | ICD-10-CM | POA: Insufficient documentation

## 2021-04-01 DIAGNOSIS — I10 Essential (primary) hypertension: Secondary | ICD-10-CM | POA: Diagnosis not present

## 2021-04-01 DIAGNOSIS — M65332 Trigger finger, left middle finger: Secondary | ICD-10-CM | POA: Insufficient documentation

## 2021-04-01 DIAGNOSIS — Z881 Allergy status to other antibiotic agents status: Secondary | ICD-10-CM | POA: Diagnosis not present

## 2021-04-01 DIAGNOSIS — Z7984 Long term (current) use of oral hypoglycemic drugs: Secondary | ICD-10-CM | POA: Insufficient documentation

## 2021-04-01 DIAGNOSIS — I471 Supraventricular tachycardia: Secondary | ICD-10-CM | POA: Diagnosis not present

## 2021-04-01 HISTORY — PX: TRIGGER FINGER RELEASE: SHX641

## 2021-04-01 HISTORY — PX: CARPAL TUNNEL RELEASE: SHX101

## 2021-04-01 LAB — GLUCOSE, CAPILLARY
Glucose-Capillary: 146 mg/dL — ABNORMAL HIGH (ref 70–99)
Glucose-Capillary: 140 mg/dL — ABNORMAL HIGH (ref 70–99)

## 2021-04-01 SURGERY — CARPAL TUNNEL RELEASE
Anesthesia: Regional | Site: Hand | Laterality: Left

## 2021-04-01 MED ORDER — PROPOFOL 10 MG/ML IV BOLUS
INTRAVENOUS | Status: DC | PRN
  Administered 2021-04-01: 10 mg via INTRAVENOUS
  Administered 2021-04-01: 20 mg via INTRAVENOUS
  Administered 2021-04-01 (×4): 10 mg via INTRAVENOUS
  Administered 2021-04-01 (×2): 20 mg via INTRAVENOUS
  Administered 2021-04-01: 10 mg via INTRAVENOUS

## 2021-04-01 MED ORDER — LIDOCAINE HCL (CARDIAC) PF 100 MG/5ML IV SOSY
PREFILLED_SYRINGE | INTRAVENOUS | Status: DC | PRN
  Administered 2021-04-01: 30 mg via INTRAVENOUS

## 2021-04-01 MED ORDER — ONDANSETRON HCL 4 MG/2ML IJ SOLN
INTRAMUSCULAR | Status: DC | PRN
  Administered 2021-04-01: 4 mg via INTRAVENOUS

## 2021-04-01 MED ORDER — BUPIVACAINE HCL (PF) 0.25 % IJ SOLN
INTRAMUSCULAR | Status: DC | PRN
  Administered 2021-04-01: 10 mL

## 2021-04-01 MED ORDER — OXYCODONE HCL 5 MG/5ML PO SOLN: 5.0000 mg | Freq: Once | ORAL | Status: DC | PRN

## 2021-04-01 MED ORDER — PROMETHAZINE HCL 25 MG/ML IJ SOLN: 6.2500 mg | INTRAMUSCULAR | Status: DC | PRN

## 2021-04-01 MED ORDER — HYDROMORPHONE HCL 1 MG/ML IJ SOLN: 0.2500 mg | INTRAMUSCULAR | Status: DC | PRN

## 2021-04-01 MED ORDER — LIDOCAINE HCL (PF) 0.5 % IJ SOLN
INTRAMUSCULAR | Status: DC | PRN
  Administered 2021-04-01: 30 mL via INTRAVENOUS

## 2021-04-01 MED ORDER — MIDAZOLAM HCL 5 MG/5ML IJ SOLN
INTRAMUSCULAR | Status: DC | PRN
  Administered 2021-04-01: 2 mg via INTRAVENOUS

## 2021-04-01 MED ORDER — MIDAZOLAM HCL 2 MG/2ML IJ SOLN
INTRAMUSCULAR | Status: AC
  Filled 2021-04-01: qty 2

## 2021-04-01 MED ORDER — FENTANYL CITRATE (PF) 100 MCG/2ML IJ SOLN
INTRAMUSCULAR | Status: AC
  Filled 2021-04-01: qty 2

## 2021-04-01 MED ORDER — FENTANYL CITRATE (PF) 100 MCG/2ML IJ SOLN
INTRAMUSCULAR | Status: DC | PRN
  Administered 2021-04-01 (×2): 50 ug via INTRAVENOUS

## 2021-04-01 MED ORDER — 0.9 % SODIUM CHLORIDE (POUR BTL) OPTIME
TOPICAL | Status: DC | PRN
  Administered 2021-04-01: 300 mL

## 2021-04-01 MED ORDER — TRAMADOL HCL 50 MG PO TABS
50.0000 mg | ORAL_TABLET | Freq: Four times a day (QID) | ORAL | 0 refills | Status: DC | PRN
Start: 2021-04-01 — End: 2021-12-15

## 2021-04-01 MED ORDER — CEFAZOLIN SODIUM-DEXTROSE 2-4 GM/100ML-% IV SOLN
2.0000 g | INTRAVENOUS | Status: AC
  Administered 2021-04-01: 2 g via INTRAVENOUS

## 2021-04-01 MED ORDER — ONDANSETRON HCL 4 MG/2ML IJ SOLN
INTRAMUSCULAR | Status: AC
  Filled 2021-04-01: qty 2

## 2021-04-01 MED ORDER — LACTATED RINGERS IV SOLN: INTRAVENOUS | Status: DC

## 2021-04-01 MED ORDER — OXYCODONE HCL 5 MG PO TABS: 5.0000 mg | ORAL_TABLET | Freq: Once | ORAL | Status: DC | PRN

## 2021-04-01 MED ORDER — CEFAZOLIN SODIUM-DEXTROSE 2-4 GM/100ML-% IV SOLN
INTRAVENOUS | Status: AC
  Filled 2021-04-01: qty 100

## 2021-04-01 SURGICAL SUPPLY — 36 items
APL PRP STRL LF DISP 70% ISPRP (MISCELLANEOUS) ×2
BLADE SURG 15 STRL LF DISP TIS (BLADE) ×2 IMPLANT
BLADE SURG 15 STRL SS (BLADE) ×3
BNDG CMPR 9X4 STRL LF SNTH (GAUZE/BANDAGES/DRESSINGS)
BNDG COHESIVE 2X5 TAN ST LF (GAUZE/BANDAGES/DRESSINGS) ×3 IMPLANT
BNDG COHESIVE 3X5 TAN STRL LF (GAUZE/BANDAGES/DRESSINGS) ×3 IMPLANT
BNDG ESMARK 4X9 LF (GAUZE/BANDAGES/DRESSINGS) IMPLANT
BNDG GAUZE ELAST 4 BULKY (GAUZE/BANDAGES/DRESSINGS) ×3 IMPLANT
CHLORAPREP W/TINT 26 (MISCELLANEOUS) ×3 IMPLANT
CORD BIPOLAR FORCEPS 12FT (ELECTRODE) ×3 IMPLANT
COVER BACK TABLE 60X90IN (DRAPES) ×3 IMPLANT
COVER MAYO STAND STRL (DRAPES) ×3 IMPLANT
CUFF TOURN SGL QUICK 18X4 (TOURNIQUET CUFF) ×3 IMPLANT
DECANTER SPIKE VIAL GLASS SM (MISCELLANEOUS) IMPLANT
DRAPE EXTREMITY T 121X128X90 (DISPOSABLE) ×3 IMPLANT
DRAPE SURG 17X23 STRL (DRAPES) ×3 IMPLANT
DRSG PAD ABDOMINAL 8X10 ST (GAUZE/BANDAGES/DRESSINGS) ×3 IMPLANT
GAUZE SPONGE 4X4 12PLY STRL (GAUZE/BANDAGES/DRESSINGS) ×3 IMPLANT
GAUZE XEROFORM 1X8 LF (GAUZE/BANDAGES/DRESSINGS) ×3 IMPLANT
GLOVE SURG ORTHO LTX SZ8 (GLOVE) ×3 IMPLANT
GLOVE SURG POLYISO LF SZ7 (GLOVE) ×3 IMPLANT
GLOVE SURG UNDER POLY LF SZ7 (GLOVE) ×3 IMPLANT
GLOVE SURG UNDER POLY LF SZ8.5 (GLOVE) ×3 IMPLANT
GOWN STRL REUS W/ TWL LRG LVL3 (GOWN DISPOSABLE) ×2 IMPLANT
GOWN STRL REUS W/TWL LRG LVL3 (GOWN DISPOSABLE) ×3
GOWN STRL REUS W/TWL XL LVL3 (GOWN DISPOSABLE) ×3 IMPLANT
NEEDLE PRECISIONGLIDE 27X1.5 (NEEDLE) ×3 IMPLANT
NS IRRIG 1000ML POUR BTL (IV SOLUTION) ×3 IMPLANT
PACK BASIN DAY SURGERY FS (CUSTOM PROCEDURE TRAY) ×3 IMPLANT
STOCKINETTE 4X48 STRL (DRAPES) ×3 IMPLANT
SUT ETHILON 4 0 PS 2 18 (SUTURE) ×3 IMPLANT
SUT VICRYL 4-0 PS2 18IN ABS (SUTURE) IMPLANT
SYR BULB EAR ULCER 3OZ GRN STR (SYRINGE) ×3 IMPLANT
SYR CONTROL 10ML LL (SYRINGE) ×3 IMPLANT
TOWEL GREEN STERILE FF (TOWEL DISPOSABLE) ×3 IMPLANT
UNDERPAD 30X36 HEAVY ABSORB (UNDERPADS AND DIAPERS) ×3 IMPLANT

## 2021-04-01 NOTE — Op Note (Signed)
NAME: Edward Armstrong. 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   ASSISTANT: none   ANESTHESIA:  Bier block with sedation and Local   INTRAVENOUS FLUIDS:  Per anesthesia flow sheet.   ESTIMATED BLOOD LOSS:  Minimal.   COMPLICATIONS:  None.   SPECIMENS:  none   TOURNIQUET TIME:    Total Tourniquet Time Documented: Forearm (Left) - 34 minutes Total: Forearm (Left) - 34 minutes    DISPOSITION:  Stable to PACU.   INDICATIONS: Patient is a 70 year old male with history of triggering of his left middle finger carpal tunnel syndrome bilaterally with numbness and tingling.  Nerve conductions are positive.  This not responded to conservative treatment including multiple injections.  Pre-.  Postoperative course been discussed along with risks and complications after he has elected to undergo surgical decompression of the median nerve and release of the A1 pulley and middle finger.  He is aware that there is no guarantee to the surgery the possibility of infection recurrence injury to arteries nerves tendons incomplete relief symptoms dystrophy.  Preoperative area the patient seen the extremity marked by both patient and surgeon antibiotic given  OPERATIVE COURSE: Patient is brought the operating room placed in the supine position with left arm free.  A forearm IV regional anesthetic was carried out without difficulty under the direction of the anesthesia department.  He was prepped with prep with ChloraPrep a 3-minute dry time allowed and a timeout taken to confirm patient procedure.  The trigger  finger was addressed first and oblique incision was made over the A1 pulley of the left middle finger carried down through subcutaneous tissue.  Bleeders were electrocauterized with bipolar.  Retractors were placed protecting the neurovascular bundles radially and ulnarly.  The flexor sheath A1 pulley was identified this was released on its radial aspect a small incision made centrally and A2.  The 2 tendons were then separated with blunt dissection using retractors breaking any adhesions.  The finger was placed in a full passive range of motion no further triggering was noted.  The wound was copious irrigated with saline.  Skin was closed interrupted 4-0 nylon sutures.  A separate incision was made longitudinally in the palm carried down through subcutaneous tissue.  Bleeders were again electrocauterized with bipolar.  Palmar fascia was split.  The superficial palmar arch was identified along with the flexor tendon to the ring little finger this allowed retraction of the median nerve flexor tendons radial and ulnar nerve ulnarly..  The flexor retinaculum was then released on its ulnar border.  Right angle and stool retractor placed between skin forearm fascia proximally.  Deep structures were dissected free with blunt dissection.  Blunt scissors were then used to release the proximal aspect of the flexor retinaculum distal forearm fascia for approximately 2 to 3 cm proximal to the wrist crease under direct vision.  The canal was explored.  Area compression of the nerve is apparent.  Motor branch entered into muscle distally.  The wound was copiously irrigated with saline.  The skin was then closed erupted 4 nylon sutures.  Local infiltration quarter percent bupivacaine  without epinephrine was given approximately 10 cc was used in both wounds a sterile compressive dressing with the fingers free was applied.  Deflation of the tourniquet all fingers immediately pink.  He was taken to the recovery room for  observation in satisfactory condition.  He will be discharged home to return to the hand center [facility] in 1 week Tylenol ibuprofen for pain with Ultram for breakthrough.  Daryll Brod, MD Electronically signed, 04/01/21

## 2021-04-01 NOTE — Anesthesia Postprocedure Evaluation (Signed)
Anesthesia Post Note  Patient: Justyce Merfeld.  Procedure(s) Performed: LEFT CARPAL TUNNEL RELEASE (Left: Hand) RELEASE TRIGGER FINGER/A-1 PULLEY LEFT MIDDLE FINGER (Left: Finger)     Patient location during evaluation: PACU Anesthesia Type: Bier Block Level of consciousness: awake and alert Pain management: pain level controlled Vital Signs Assessment: post-procedure vital signs reviewed and stable Respiratory status: spontaneous breathing, nonlabored ventilation and respiratory function stable Cardiovascular status: blood pressure returned to baseline and stable Postop Assessment: no apparent nausea or vomiting Anesthetic complications: no   No notable events documented.  Last Vitals:  Vitals:   04/01/21 1115 04/01/21 1145  BP: 114/78 131/81  Pulse: 61 (!) 58  Resp: 20 18  Temp:  36.5 C  SpO2: 96% 97%    Last Pain:  Vitals:   04/01/21 1145  TempSrc:   PainSc: 0-No pain                 Lynda Rainwater

## 2021-04-01 NOTE — Anesthesia Procedure Notes (Signed)
Anesthesia Regional Block: Bier block (IV Regional)   Pre-Anesthetic Checklist: , timeout performed,  Correct Patient, Correct Site, Correct Laterality,  Correct Procedure,, site marked,  Surgical consent,  At surgeon's request  Laterality: Left         Needles:  Injection technique: Single-shot  Needle Type: Other      Needle Gauge: 20     Additional Needles:   Procedures:,,,,, intact distal pulses, Esmarch exsanguination,  Single tourniquet utilized    Narrative:  Start time: 04/01/2021 10:15 AM End time: 04/01/2021 10:15 AM  Performed by: Personally

## 2021-04-01 NOTE — H&P (Signed)
Edward Armstrong. is an 70 y.o. male.   Chief Complaint: Numbness left hand catching of the left middle finger HPI: Edward Armstrong is a 70 y.o. White or Caucasian male presents with chief complaint of left middlefinger  Maxine states he plays pickle ball and after holding onto the racquet for along time the catching is worse.  He is post 2 injections. He has a history of carpal tunnel syndrome and diabetes. We have recommended repeating his nerve conductions to see if the carpal tunnel is still present with the possibility of release of the A1 pulley of the middle finger. He has had nerve conductions done by Dr. Arrie Eastern revealing a carpal tunnel syndrome bilaterally with a motor delay of 5 05 on the right side 521 on the left side. His sensory is diminished bilaterally. 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 He continues complain numbness and tingling compliant of the catching of his left middle finger. He has a history of diabetes no history of thyroid problems arthritis or gout. Family history is negative for each Past Medical History:  Diagnosis Date   Allergy    Anxiety    Diabetes mellitus without complication (HCC)    GERD (gastroesophageal reflux disease)    Hyperlipidemia    Hypertension    SVT (supraventricular tachycardia) ([place])     Past Surgical History:  Procedure Laterality Date   APPENDECTOMY     SVT ABLATION N/A 08/02/2020   Procedure: SVT ABLATION;  Surgeon: Constance Haw, MD;  Location: [facility];  Service: Cardiovascular;  Laterality: N/A;   VASECTOMY      Family History  Problem Relation Age of Onset   Diabetes Mother    Heart attack Father    Colon cancer Neg Hx    Esophageal cancer Neg Hx    Liver cancer Neg Hx    Pancreatic cancer Neg Hx    Rectal cancer Neg Hx    Stomach cancer Neg Hx    Social History:  reports that he has never smoked. He has never used smokeless tobacco. He  reports current alcohol use. He reports that he does not use drugs.  Allergies:  Allergies  Allergen Reactions   Macrodantin [Nitrofurantoin Macrocrystal] Hives   Clindamycin/Lincomycin Hives   Septra [Sulfamethoxazole-Trimethoprim] Hives    No medications prior to admission.    Results for orders placed or performed during the hospital encounter of 04/01/21 (from the past 48 hour(s))  Basic metabolic panel per protocol     Status: Abnormal   Collection Time: 03/31/21  2:30 PM  Result Value Ref Range   Sodium 138 135 - 145 mmol/L   Potassium 4.6 3.5 - 5.1 mmol/L   Chloride 107 98 - 111 mmol/L   CO2 24 22 - 32 mmol/L   Glucose, Bld 149 (H) 70 - 99 mg/dL    Comment: Glucose reference range applies only to samples taken after fasting for at least 8 hours.   BUN 15 8 - 23 mg/dL   Creatinine, Ser 1.08 0.61 - 1.24 mg/dL   Calcium 8.8 (L) 8.9 - 10.3 mg/dL   GFR, Estimated >60 >60 mL/min    Comment: (NOTE) Calculated using the CKD-EPI Creatinine Equation (2021)    Anion gap 7 5 - 15    Comment: Performed at [facility] [address], [place], [place] [address]    No results found.  Pertinent items are noted in HPI.  Height '5\' 11"'$  (1.803 m), weight 90.7 kg.  General appearance: alert, cooperative, and appears stated age Head: Normocephalic, without obvious abnormality Neck: no JVD Resp: clear to auscultation bilaterally Cardio: regular rate and rhythm, S1, S2 normal, no murmur, click, rub or gallop GI: soft, non-tender; bowel sounds normal; no masses,  no organomegaly Extremities:  Numbness left hand with catching left middle finger Pulses: 2+ and symmetric Skin: Skin color, texture, turgor normal. No rashes or lesions Neurologic: Grossly normal Incision/Wound: na  Assessment/Plan Assessment:  Diagnosis carpal tunnel syndrome left hand with triggering left middle finger   Plan: We have discussed proceeding with surgical release of the A1 pulley left middle  finger and carpal tunnel decompression left hand. Pre-. Postoperative course are discussed along with risk and complications. He is aware there is no guarantee to the surgery the possibility of infection recurrence injury to arteries nerves tendons incomplete relief symptoms dystrophy. He like to proceed and is scheduled as an outpatient under regional anesthesia.  Daryll Brod 04/01/2021, 5:10 AM

## 2021-04-01 NOTE — Anesthesia Preprocedure Evaluation (Signed)
Anesthesia Evaluation  Patient identified by MRN, date of birth, ID band Patient awake    Reviewed: Allergy & Precautions, NPO status , Patient's Chart, lab work & pertinent test results  History of Anesthesia Complications (+) PROLONGED EMERGENCE  Airway Mallampati: II  TM Distance: >3 FB Neck ROM: Full    Dental  (+) Dental Advisory Given   Pulmonary neg pulmonary ROS,  07/31/2020 SARS coronavirus NEG   breath sounds clear to auscultation       Cardiovascular hypertension, Pt. on medications (-) angina Rhythm:Regular Rate:Normal  05/2020 ECHO: EF 55-60%, normal LVF, no significant valvular abnormalities   Neuro/Psych Anxiety negative neurological ROS     GI/Hepatic Neg liver ROS, GERD  Medicated and Controlled,  Endo/Other  diabetes, Oral Hypoglycemic Agents  Renal/GU negative Renal ROS     Musculoskeletal   Abdominal   Peds  Hematology   Anesthesia Other Findings   Reproductive/Obstetrics                             Anesthesia Physical  Anesthesia Plan  ASA: III  Anesthesia Plan: Bier Block and Bier Block-LIDOCAINE ONLY   Post-op Pain Management:    Induction: Intravenous  PONV Risk Score and Plan: 1 and Ondansetron and Treatment may vary due to age or medical condition  Airway Management Planned: Natural Airway and Simple Face Mask  Additional Equipment: None  Intra-op Plan:   Post-operative Plan:   Informed Consent: I have reviewed the patients History and Physical, chart, labs and discussed the procedure including the risks, benefits and alternatives for the proposed anesthesia with the patient or authorized representative who has indicated his/her understanding and acceptance.     Dental advisory given  Plan Discussed with: CRNA and Surgeon  Anesthesia Plan Comments:         Anesthesia Quick Evaluation

## 2021-04-01 NOTE — Brief Op Note (Signed)
04/01/2021  10:57 AM  PATIENT:  Edward Armstrong.  70 y.o. male  PRE-OPERATIVE DIAGNOSIS:  LEFT CARPAL TUNNEL SYNDROME; LEFT MIDDLE TRIGGER FINGER  POST-OPERATIVE DIAGNOSIS:  LEFT CARPAL TUNNEL SYNDROME; LEFT MIDDLE TRIGGER FINGER  PROCEDURE:  Procedure(s) with comments: LEFT CARPAL TUNNEL RELEASE (Left) - MAC and beir block RELEASE TRIGGER FINGER/A-1 PULLEY LEFT MIDDLE FINGER (Left) - MAC and beir block  SURGEON:  Surgeon(s) and Role:    * Daryll Brod, MD - Primary  PHYSICIAN ASSISTANT:   ASSISTANTS: none   ANESTHESIA:   local, regional, and IV sedation  EBL:  33m   BLOOD ADMINISTERED:none  DRAINS: none   LOCAL MEDICATIONS USED:  BUPIVICAINE   SPECIMEN:  No Specimen  DISPOSITION OF SPECIMEN:  N/A  COUNTS:  YES  TOURNIQUET:   Total Tourniquet Time Documented: Forearm (Left) - 34 minutes Total: Forearm (Left) - 34 minutes   DICTATION: .DViviann SpareDictation  PLAN OF CARE: Discharge to home after PACU  PATIENT DISPOSITION:  PACU - hemodynamically stable.

## 2021-04-01 NOTE — Discharge Instructions (Addendum)

## 2021-04-01 NOTE — Transfer of Care (Signed)
Immediate Anesthesia Transfer of Care Note  Patient: Edward Armstrong.  Procedure(s) Performed: LEFT CARPAL TUNNEL RELEASE (Left: Hand) RELEASE TRIGGER FINGER/A-1 PULLEY LEFT MIDDLE FINGER (Left: Finger)  Patient Location: PACU  Anesthesia Type:MAC and Bier block  Level of Consciousness: awake, alert  and oriented  Airway & Oxygen Therapy: Patient Spontanous Breathing and Patient connected to face mask oxygen  Post-op Assessment: Report given to RN and Post -op Vital signs reviewed and stable  Post vital signs: Reviewed and stable  Last Vitals:  Vitals Value Taken Time  BP    Temp    Pulse 64 04/01/21 1057  Resp    SpO2 98 % 04/01/21 1057  Vitals shown include unvalidated device data.  Last Pain:  Vitals:   04/01/21 0807  TempSrc: Oral  PainSc: 0-No pain         Complications: No notable events documented.

## 2021-04-02 ENCOUNTER — Encounter (HOSPITAL_BASED_OUTPATIENT_CLINIC_OR_DEPARTMENT_OTHER): Payer: Self-pay | Admitting: Orthopedic Surgery

## 2021-05-09 DIAGNOSIS — S76311A Strain of muscle, fascia and tendon of the posterior muscle group at thigh level, right thigh, initial encounter: Secondary | ICD-10-CM | POA: Diagnosis not present

## 2021-05-16 DIAGNOSIS — E1159 Type 2 diabetes mellitus with other circulatory complications: Secondary | ICD-10-CM | POA: Diagnosis not present

## 2021-05-23 DIAGNOSIS — R7309 Other abnormal glucose: Secondary | ICD-10-CM | POA: Diagnosis not present

## 2021-05-23 DIAGNOSIS — I10 Essential (primary) hypertension: Secondary | ICD-10-CM | POA: Diagnosis not present

## 2021-05-23 DIAGNOSIS — Z23 Encounter for immunization: Secondary | ICD-10-CM | POA: Diagnosis not present

## 2021-05-23 DIAGNOSIS — E1100 Type 2 diabetes mellitus with hyperosmolarity without nonketotic hyperglycemic-hyperosmolar coma (NKHHC): Secondary | ICD-10-CM | POA: Diagnosis not present

## 2021-05-23 DIAGNOSIS — I471 Supraventricular tachycardia: Secondary | ICD-10-CM | POA: Diagnosis not present

## 2021-05-26 ENCOUNTER — Encounter: Payer: Self-pay | Admitting: Gastroenterology

## 2021-06-26 ENCOUNTER — Encounter: Payer: Self-pay | Admitting: Gastroenterology

## 2021-08-19 DIAGNOSIS — E1159 Type 2 diabetes mellitus with other circulatory complications: Secondary | ICD-10-CM | POA: Diagnosis not present

## 2021-08-22 DIAGNOSIS — R7309 Other abnormal glucose: Secondary | ICD-10-CM | POA: Diagnosis not present

## 2021-08-22 DIAGNOSIS — I700 Atherosclerosis of aorta: Secondary | ICD-10-CM | POA: Diagnosis not present

## 2021-08-22 DIAGNOSIS — I10 Essential (primary) hypertension: Secondary | ICD-10-CM | POA: Diagnosis not present

## 2021-08-22 DIAGNOSIS — K862 Cyst of pancreas: Secondary | ICD-10-CM | POA: Diagnosis not present

## 2021-08-22 DIAGNOSIS — E1100 Type 2 diabetes mellitus with hyperosmolarity without nonketotic hyperglycemic-hyperosmolar coma (NKHHC): Secondary | ICD-10-CM | POA: Diagnosis not present

## 2021-08-26 ENCOUNTER — Other Ambulatory Visit: Payer: Self-pay | Admitting: Internal Medicine

## 2021-08-26 DIAGNOSIS — K862 Cyst of pancreas: Secondary | ICD-10-CM

## 2021-09-10 ENCOUNTER — Encounter: Payer: Medicare Other | Admitting: Gastroenterology

## 2021-09-24 ENCOUNTER — Other Ambulatory Visit: Payer: Self-pay

## 2021-09-24 ENCOUNTER — Ambulatory Visit
Admission: RE | Admit: 2021-09-24 | Discharge: 2021-09-24 | Disposition: A | Discharge status: Home / Self Care | Payer: Medicare PPO | Source: Ambulatory Visit | Attending: Internal Medicine | Admitting: Internal Medicine

## 2021-09-24 DIAGNOSIS — K862 Cyst of pancreas: Secondary | ICD-10-CM

## 2021-09-29 ENCOUNTER — Other Ambulatory Visit (HOSPITAL_COMMUNITY): Payer: Self-pay | Admitting: Internal Medicine

## 2021-09-29 ENCOUNTER — Other Ambulatory Visit: Payer: Self-pay | Admitting: Internal Medicine

## 2021-09-29 DIAGNOSIS — E041 Nontoxic single thyroid nodule: Secondary | ICD-10-CM

## 2021-10-03 ENCOUNTER — Ambulatory Visit (HOSPITAL_COMMUNITY): Admission: RE | Admit: 2021-10-03 | Payer: Medicare PPO | Source: Ambulatory Visit

## 2021-10-03 ENCOUNTER — Encounter (HOSPITAL_COMMUNITY): Payer: Self-pay

## 2021-10-14 ENCOUNTER — Ambulatory Visit (HOSPITAL_COMMUNITY)
Admission: RE | Admit: 2021-10-14 | Discharge: 2021-10-14 | Disposition: A | Discharge status: Home / Self Care | Payer: Medicare PPO | Source: Ambulatory Visit | Attending: Internal Medicine | Admitting: Internal Medicine

## 2021-10-14 ENCOUNTER — Other Ambulatory Visit: Payer: Self-pay

## 2021-10-14 DIAGNOSIS — E041 Nontoxic single thyroid nodule: Secondary | ICD-10-CM | POA: Insufficient documentation

## 2021-10-16 ENCOUNTER — Other Ambulatory Visit: Payer: Self-pay

## 2021-10-16 ENCOUNTER — Ambulatory Visit: Payer: Medicare PPO

## 2021-10-23 ENCOUNTER — Encounter: Payer: Medicare Other | Admitting: Gastroenterology

## 2021-11-17 ENCOUNTER — Encounter (INDEPENDENT_AMBULATORY_CARE_PROVIDER_SITE_OTHER): Payer: Medicare PPO | Admitting: Ophthalmology

## 2021-11-17 ENCOUNTER — Other Ambulatory Visit: Payer: Self-pay

## 2021-11-17 DIAGNOSIS — H35033 Hypertensive retinopathy, bilateral: Secondary | ICD-10-CM

## 2021-11-17 DIAGNOSIS — I10 Essential (primary) hypertension: Secondary | ICD-10-CM

## 2021-11-17 DIAGNOSIS — H2513 Age-related nuclear cataract, bilateral: Secondary | ICD-10-CM

## 2021-11-17 DIAGNOSIS — H43813 Vitreous degeneration, bilateral: Secondary | ICD-10-CM

## 2021-11-17 DIAGNOSIS — D3131 Benign neoplasm of right choroid: Secondary | ICD-10-CM | POA: Diagnosis not present

## 2021-11-17 DIAGNOSIS — H33301 Unspecified retinal break, right eye: Secondary | ICD-10-CM

## 2021-11-17 DIAGNOSIS — D3132 Benign neoplasm of left choroid: Secondary | ICD-10-CM | POA: Diagnosis not present

## 2021-11-30 ENCOUNTER — Other Ambulatory Visit: Payer: Self-pay | Admitting: Cardiology

## 2021-12-07 ENCOUNTER — Encounter: Payer: Self-pay | Admitting: Certified Registered Nurse Anesthetist

## 2021-12-08 ENCOUNTER — Other Ambulatory Visit: Payer: Self-pay

## 2021-12-08 ENCOUNTER — Ambulatory Visit (AMBULATORY_SURGERY_CENTER): Payer: Medicare PPO | Admitting: *Deleted

## 2021-12-08 ENCOUNTER — Encounter: Payer: Self-pay | Admitting: Gastroenterology

## 2021-12-08 VITALS — Ht 71.0 in | Wt 200.0 lb

## 2021-12-08 DIAGNOSIS — Z860100 Personal history of colon polyps, unspecified: Secondary | ICD-10-CM

## 2021-12-08 MED ORDER — PEG 3350-KCL-NA BICARB-NACL 420 G PO SOLR: 4000.0000 mL | Freq: Once | ORAL | 0 refills | Status: AC

## 2021-12-08 NOTE — Progress Notes (Signed)

## 2021-12-14 ENCOUNTER — Encounter: Payer: Self-pay | Admitting: Certified Registered Nurse Anesthetist

## 2021-12-15 ENCOUNTER — Encounter: Payer: Self-pay | Admitting: Gastroenterology

## 2021-12-15 ENCOUNTER — Ambulatory Visit (AMBULATORY_SURGERY_CENTER): Payer: Medicare PPO | Admitting: Gastroenterology

## 2021-12-15 VITALS — BP 132/70 | HR 65 | Temp 98.6°F | Resp 10 | Ht 71.0 in | Wt 200.0 lb

## 2021-12-15 DIAGNOSIS — K635 Polyp of colon: Secondary | ICD-10-CM

## 2021-12-15 DIAGNOSIS — D124 Benign neoplasm of descending colon: Secondary | ICD-10-CM

## 2021-12-15 DIAGNOSIS — Z860100 Personal history of colon polyps, unspecified: Secondary | ICD-10-CM

## 2021-12-15 DIAGNOSIS — D120 Benign neoplasm of cecum: Secondary | ICD-10-CM

## 2021-12-15 DIAGNOSIS — D123 Benign neoplasm of transverse colon: Secondary | ICD-10-CM

## 2021-12-15 MED ORDER — SODIUM CHLORIDE 0.9 % IV SOLN: 500.0000 mL | Freq: Once | INTRAVENOUS | Status: DC

## 2021-12-15 NOTE — Op Note (Signed)
[facility] ?Patient Name: Edward Armstrong ?Procedure Date: 12/15/2021 10:22 AM ?MRN: [id number] ?Endoscopist: Carlota Raspberry. Havery Moros , MD ?Age: 71 ?Referring MD:  ?Date of Birth: 26-Feb-1951 ?Gender: Male ?Account #: [id number] ?Procedure:                Colonoscopy ?Indications:              High risk colon cancer surveillance: Personal  ?                          history of colonic polyps - 01/2018 - 5 polyps -  ?                          most adenomas / sessile serrated ?Medicines:                Monitored Anesthesia Care ?Procedure:                Pre-Anesthesia Assessment: ?                          - Prior to the procedure, a History and Physical  ?                          was performed, and patient medications and  ?                          allergies were reviewed. The patient's tolerance of  ?                          previous anesthesia was also reviewed. The risks  ?                          and benefits of the procedure and the sedation  ?                          options and risks were discussed with the patient.  ?                          All questions were answered, and informed consent  ?                          was obtained. Prior Anticoagulants: The patient has  ?                          taken no previous anticoagulant or antiplatelet  ?                          agents. ASA Grade Assessment: II - A patient with  ?                          mild systemic disease. After reviewing the risks  ?                          and benefits, the patient was deemed in  ?  satisfactory condition to undergo the procedure. ?                          After obtaining informed consent, the colonoscope  ?                          was passed under direct vision. Throughout the  ?                          procedure, the patient's blood pressure, pulse, and  ?                          oxygen saturations were monitored continuously. The  ?                          CF [id number] #[id number] was  introduced through the anus  ?                          and advanced to the the cecum, identified by  ?                          appendiceal orifice and ileocecal valve. The  ?                          colonoscopy was performed without difficulty. The  ?                          patient tolerated the procedure well. The quality  ?                          of the bowel preparation was adequate. The  ?                          ileocecal valve, appendiceal orifice, and rectum  ?                          were photographed. ?Scope In: 10:39:58 AM ?Scope Out: 11:00:35 AM ?Scope Withdrawal Time: 0 hours 15 minutes 39 seconds  ?Total Procedure Duration: 0 hours 20 minutes 37 seconds  ?Findings:                 The perianal and digital rectal examinations were  ?                          normal. ?                          A diminutive polyp was found in the cecum. The  ?                          polyp was sessile. The polyp was removed with a  ?                          cold snare. Resection and retrieval were complete. ?  A diminutive polyp was found in the hepatic  ?                          flexure. The polyp was sessile. The polyp was  ?                          removed with a cold snare. Resection and retrieval  ?                          were complete. ?                          A 5 mm polyp was found in the descending colon. The  ?                          polyp was sessile. The polyp was removed with a  ?                          cold snare. Resection and retrieval were complete. ?                          Multiple small-mouthed diverticula were found in  ?                          the sigmoid colon. ?                          Internal hemorrhoids were found during  ?                          retroflexion. The hemorrhoids were small. ?                          The exam was otherwise without abnormality. Prep  ?                          was adequate but several minutes spent lavaging the  ?                           colon to achieve adequate views. ?Complications:            No immediate complications. Estimated blood loss:  ?                          Minimal. ?Estimated Blood Loss:     Estimated blood loss was minimal. ?Impression:               - One diminutive polyp in the cecum, removed with a  ?                          cold snare. Resected and retrieved. ?                          - One diminutive polyp at the hepatic flexure,  ?  removed with a cold snare. Resected and retrieved. ?                          - One 5 mm polyp in the descending colon, removed  ?                          with a cold snare. Resected and retrieved. ?                          - Diverticulosis in the sigmoid colon. ?                          - Internal hemorrhoids. ?                          - The examination was otherwise normal. ?Recommendation:           - Patient has a contact number available for  ?                          emergencies. The signs and symptoms of potential  ?                          delayed complications were discussed with the  ?                          patient. Return to normal activities tomorrow.  ?                          Written discharge instructions were provided to the  ?                          patient. ?                          - Resume previous diet. ?                          - Continue present medications. ?                          - Await pathology results. ?Carlota Raspberry. Makhayla Mcmurry, MD ?12/15/2021 11:06:07 AM ?This report has been signed electronically. ?

## 2021-12-15 NOTE — Progress Notes (Signed)
Report given to PACU, vss 

## 2021-12-15 NOTE — Progress Notes (Signed)
[facility] History and Physical ? ? ?Primary Care Physician:  Asencion Noble, MD ? ? ?Reason for Procedure:   History of colon polyps ? ?Plan:    colonoscopy ? ? ? ? ?HPI: Edward Armstrong. is a 71 y.o. male  here for colonoscopy surveillance - 5 polyps removed 01/2018 - at least 3 TA / SSPs. Patient denies any bowel symptoms at this time. No family history of colon cancer known. Otherwise feels well without any cardiopulmonary symptoms.  ? ? ?Past Medical History:  ?Diagnosis Date  ? Allergy   ? SEASONAL  ? Anxiety   ? Cataract   ? BEGINNING,BILATERAL  ? Diabetes mellitus without complication ([place])   ? GERD (gastroesophageal reflux disease)   ? Hyperlipidemia   ? Hypertension   ? SVT (supraventricular tachycardia) ([place])   ? ? ?Past Surgical History:  ?Procedure Laterality Date  ? APPENDECTOMY    ? CARPAL TUNNEL RELEASE Left 04/01/2021  ? Procedure: LEFT CARPAL TUNNEL RELEASE;  Surgeon: Daryll Brod, MD;  Location: [facility];  Service: Orthopedics;  Laterality: Left;  MAC and beir block  ? COLONOSCOPY    ? EYE SURGERY Right   ? LASER FOR RETINA TEAR  ? POLYPECTOMY    ? SVT ABLATION N/A 08/02/2020  ? Procedure: SVT ABLATION;  Surgeon: Constance Haw, MD;  Location: [facility];  Service: Cardiovascular;  Laterality: N/A;  ? TRIGGER FINGER RELEASE Left 04/01/2021  ? Procedure: RELEASE TRIGGER FINGER/A-1 PULLEY LEFT MIDDLE FINGER;  Surgeon: Daryll Brod, MD;  Location: [facility];  Service: Orthopedics;  Laterality: Left;  MAC and beir block  ? VASECTOMY    ? ? ?Prior to Admission medications   ?Medication Sig Start Date End Date Taking? Authorizing Provider  ?acetaminophen (TYLENOL) 500 MG tablet Take 1,000 mg by mouth every 6 (six) hours as needed for mild pain or headache.   Yes [provider]  ?ALPRAZolam (XANAX) 0.25 MG tablet Take 0.25 mg by mouth in the morning and at bedtime.   Yes [provider]  ?aspirin EC 81 MG tablet Take 81 mg by mouth  daily.   Yes [provider]  ?ezetimibe (ZETIA) 10 MG tablet Take 10 mg by mouth at bedtime.    Yes [provider]  ?metFORMIN (GLUCOPHAGE) 1000 MG tablet Take 1,000 mg by mouth 2 (two) times daily with a meal.    Yes [provider]  ?metoprolol succinate (TOPROL-XL) 50 MG 24 hr tablet TAKE 1 TABLET BY MOUTH DAILY. TAKE WITH OR IMMEDIATELY FOLLOWING A MEAL. 12/01/21  Yes Camnitz, Ocie Doyne, MD  ?Multiple Vitamins-Minerals (VITAMIN C EFFERVESCENT BLEND PO) Take 1 packet by mouth daily.   Yes [provider]  ?omeprazole (PRILOSEC) 40 MG capsule Take 40 mg by mouth daily.   Yes [provider]  ?Propylene Glycol (SYSTANE COMPLETE OP) Place 1 drop into both eyes in the morning and at bedtime.   Yes [provider]  ?diltiazem (CARDIZEM) 30 MG tablet Take 1 tablet (30 mg total) by mouth every 6 (six) hours as needed (for elevated heart rates). ?Patient not taking: Reported on 12/08/2021 07/12/20   Camnitz, Ocie Doyne, MD  ?OZEMPIC, 0.25 OR 0.5 MG/DOSE, 2 MG/1.5ML SOPN Inject into the skin. 11/09/21   [provider]  ? ? ?Current Outpatient Medications  ?Medication Sig Dispense Refill  ? acetaminophen (TYLENOL) 500 MG tablet Take 1,000 mg by mouth every 6 (six) hours as needed for mild pain or  headache.    ? ALPRAZolam (XANAX) 0.25 MG tablet Take 0.25 mg by mouth in the morning and at bedtime.    ? aspirin EC 81 MG tablet Take 81 mg by mouth daily.    ? ezetimibe (ZETIA) 10 MG tablet Take 10 mg by mouth at bedtime.     ? metFORMIN (GLUCOPHAGE) 1000 MG tablet Take 1,000 mg by mouth 2 (two) times daily with a meal.     ? metoprolol succinate (TOPROL-XL) 50 MG 24 hr tablet TAKE 1 TABLET BY MOUTH DAILY. TAKE WITH OR IMMEDIATELY FOLLOWING A MEAL. 30 tablet 0  ? Multiple Vitamins-Minerals (VITAMIN C EFFERVESCENT BLEND PO) Take 1 packet by mouth daily.    ? omeprazole (PRILOSEC) 40 MG capsule Take 40 mg by mouth daily.    ? Propylene Glycol (SYSTANE COMPLETE OP)  Place 1 drop into both eyes in the morning and at bedtime.    ? diltiazem (CARDIZEM) 30 MG tablet Take 1 tablet (30 mg total) by mouth every 6 (six) hours as needed (for elevated heart rates). (Patient not taking: Reported on 12/08/2021) 30 tablet 1  ? OZEMPIC, 0.25 OR 0.5 MG/DOSE, 2 MG/1.5ML SOPN Inject into the skin.    ? ?Current Facility-Administered Medications  ?Medication Dose Route Frequency Provider Last Rate Last Admin  ? 0.9 %  sodium chloride infusion  500 mL Intravenous Once Navy Belay, Carlota Raspberry, MD      ? ? ?Allergies as of 12/15/2021 - Review Complete 12/15/2021  ?Allergen Reaction Noted  ? Macrodantin [nitrofurantoin macrocrystal] Hives 02/21/2013  ? Clindamycin/lincomycin Hives 08/27/2017  ? Septra [sulfamethoxazole-trimethoprim] Hives 02/21/2013  ? ? ?Family History  ?Problem Relation Age of Onset  ? Diabetes Mother   ? Heart attack Father   ? Colon cancer Neg Hx   ? Esophageal cancer Neg Hx   ? Liver cancer Neg Hx   ? Pancreatic cancer Neg Hx   ? Rectal cancer Neg Hx   ? Stomach cancer Neg Hx   ? Colon polyps Neg Hx   ? Crohn's disease Neg Hx   ? ? ?Social History  ? ?Socioeconomic History  ? Marital status: Married  ?  Spouse name: Not on file  ? Number of children: Not on file  ? Years of education: Not on file  ? Highest education level: Not on file  ?Occupational History  ? Not on file  ?Tobacco Use  ? Smoking status: Never  ?  Passive exposure: Past ("long time ago")  ? Smokeless tobacco: Never  ?Vaping Use  ? Vaping Use: Never used  ?Substance and Sexual Activity  ? Alcohol use: Yes  ?  Comment: social  ? Drug use: No  ? Sexual activity: Not on file  ?Other Topics Concern  ? Not on file  ?Social History Narrative  ? Not on file  ? ?Social Determinants of Health  ? ?Financial Resource Strain: Not on file  ?Food Insecurity: Not on file  ?Transportation Needs: Not on file  ?Physical Activity: Not on file  ?Stress: Not on file  ?Social Connections: Not on file  ?Intimate Partner Violence: Not on  file  ? ? ?Review of Systems: ?All other review of systems negative except as mentioned in the HPI. ? ?Physical Exam: ?Vital signs ?BP (!) 164/91   Pulse 71   Temp 98.6 ?F (37 ?C) (Skin)   Resp 13   Ht _0  (1.803 m)   Wt 200 lb (90.7 kg)   SpO2 100%   BMI 27.89 kg/m?  ? ?  General:   Alert,  Well-developed, pleasant and cooperative in NAD ?Lungs:  Clear throughout to auscultation.   ?Heart:  Regular rate and rhythm ?Abdomen:  Soft, nontender and nondistended.   ?Neuro/Psych:  Alert and cooperative. Normal mood and affect. A and O x 3 ? ?Jolly Mango, MD ?[facility] ? ? ?

## 2021-12-15 NOTE — Patient Instructions (Signed)
Handouts given on diverticulosis, hemorrhoids, and polyps. ?Await pathology results. ?Resume previous diet and continue present medications. ?Repeat colonoscopy for surveillance will be determined based off of pathology results. ? ?YOU HAD AN ENDOSCOPIC PROCEDURE TODAY AT [facility]:   Refer to the procedure report that was given to you for any specific questions about what was found during the examination.  If the procedure report does not answer your questions, please call your gastroenterologist to clarify.  If you requested that your care partner not be given the details of your procedure findings, then the procedure report has been included in a sealed envelope for you to review at your convenience later. ? ?YOU SHOULD EXPECT: Some feelings of bloating in the abdomen. Passage of more gas than usual.  Walking can help get rid of the air that was put into your GI tract during the procedure and reduce the bloating. If you had a lower endoscopy (such as a colonoscopy or flexible sigmoidoscopy) you may notice spotting of blood in your stool or on the toilet paper. If you underwent a bowel prep for your procedure, you may not have a normal bowel movement for a few days. ? ?Please Note:  You might notice some irritation and congestion in your nose or some drainage.  This is from the oxygen used during your procedure.  There is no need for concern and it should clear up in a day or so. ? ?SYMPTOMS TO REPORT IMMEDIATELY: ? ?Following lower endoscopy (colonoscopy or flexible sigmoidoscopy): ? Excessive amounts of blood in the stool ? Significant tenderness or worsening of abdominal pains ? Swelling of the abdomen that is new, acute ? Fever of 100?F or higher ? ?For urgent or emergent issues, a gastroenterologist can be reached at any hour by calling [phone]. ?Do not use MyChart messaging for urgent concerns.  ? ? ?DIET:  We do recommend a small meal at first, but then you may proceed to your  regular diet.  Drink plenty of fluids but you should avoid alcoholic beverages for 24 hours. ? ?ACTIVITY:  You should plan to take it easy for the rest of today and you should NOT DRIVE or use heavy machinery until tomorrow (because of the sedation medicines used during the test).   ? ?FOLLOW UP: ?Our staff will call the number listed on your records 48-72 hours following your procedure to check on you and address any questions or concerns that you may have regarding the information given to you following your procedure. If we do not reach you, we will leave a message.  We will attempt to reach you two times.  During this call, we will ask if you have developed any symptoms of COVID 19. If you develop any symptoms (ie: fever, flu-like symptoms, shortness of breath, cough etc.) before then, please call [phone].  If you test positive for Covid 19 in the 2 weeks post procedure, please call and report this information to [place].   ? ?If any biopsies were taken you will be contacted by phone or by letter within the next 1-3 weeks.  Please call us at [phone] if you have not heard about the biopsies in 3 weeks.  ? ? ?SIGNATURES/CONFIDENTIALITY: ?You and/or your care partner have signed paperwork which will be entered into your electronic medical record.  These signatures attest to the fact that that the information above on your After Visit Summary has been reviewed and is understood.  Full responsibility of the confidentiality  of this discharge information lies with you and/or your care-partner.  ?

## 2021-12-15 NOTE — Progress Notes (Signed)
Pt's states no medical or surgical changes since previsit or office visit. VS assessed by C.W 

## 2021-12-15 NOTE — Progress Notes (Signed)
Called to room to assist during endoscopic procedure.  Patient ID and intended procedure confirmed with present staff. Received instructions for my participation in the procedure from the performing physician.  

## 2021-12-17 ENCOUNTER — Telehealth: Payer: Self-pay

## 2021-12-17 ENCOUNTER — Telehealth: Payer: Self-pay | Admitting: *Deleted

## 2021-12-17 NOTE — Telephone Encounter (Signed)
Attempted to call patient for their post-procedure follow-up call. No answer. Left voicemail.   

## 2021-12-17 NOTE — Telephone Encounter (Signed)
?  Follow up Call- ? ? ?  12/15/2021  ? 10:19 AM  ?Call back number  ?Post procedure Call Back phone  # [phone]  ?Permission to leave phone message Yes  ?  ? ?Patient questions: ? ?Do you have a fever, pain , or abdominal swelling? No. ?Pain Score  0 * ? ?Have you tolerated food without any problems? Yes.   ? ?Have you been able to return to your normal activities? Yes.   ? ?Do you have any questions about your discharge instructions: ?Diet   No. ?Medications  No. ?Follow up visit  No. ? ?Do you have questions or concerns about your Care? No. ? ?Actions: ?* If pain score is 4 or above: ?No action needed, pain <4. ? ? ?

## 2021-12-22 ENCOUNTER — Other Ambulatory Visit (HOSPITAL_COMMUNITY): Payer: Self-pay | Admitting: Internal Medicine

## 2021-12-22 ENCOUNTER — Ambulatory Visit (HOSPITAL_COMMUNITY)
Admission: RE | Admit: 2021-12-22 | Discharge: 2021-12-22 | Disposition: A | Discharge status: Home / Self Care | Payer: Medicare PPO | Source: Ambulatory Visit | Attending: Internal Medicine | Admitting: Internal Medicine

## 2021-12-22 DIAGNOSIS — M25511 Pain in right shoulder: Secondary | ICD-10-CM | POA: Diagnosis present

## 2021-12-31 DIAGNOSIS — M9902 Segmental and somatic dysfunction of thoracic region: Secondary | ICD-10-CM | POA: Diagnosis not present

## 2021-12-31 DIAGNOSIS — M9905 Segmental and somatic dysfunction of pelvic region: Secondary | ICD-10-CM | POA: Diagnosis not present

## 2021-12-31 DIAGNOSIS — M546 Pain in thoracic spine: Secondary | ICD-10-CM | POA: Diagnosis not present

## 2021-12-31 DIAGNOSIS — M9903 Segmental and somatic dysfunction of lumbar region: Secondary | ICD-10-CM | POA: Diagnosis not present

## 2022-01-05 DIAGNOSIS — M9905 Segmental and somatic dysfunction of pelvic region: Secondary | ICD-10-CM | POA: Diagnosis not present

## 2022-01-05 DIAGNOSIS — M546 Pain in thoracic spine: Secondary | ICD-10-CM | POA: Diagnosis not present

## 2022-01-05 DIAGNOSIS — M9903 Segmental and somatic dysfunction of lumbar region: Secondary | ICD-10-CM | POA: Diagnosis not present

## 2022-01-05 DIAGNOSIS — M9902 Segmental and somatic dysfunction of thoracic region: Secondary | ICD-10-CM | POA: Diagnosis not present

## 2022-01-12 DIAGNOSIS — M546 Pain in thoracic spine: Secondary | ICD-10-CM | POA: Diagnosis not present

## 2022-01-12 DIAGNOSIS — M9905 Segmental and somatic dysfunction of pelvic region: Secondary | ICD-10-CM | POA: Diagnosis not present

## 2022-01-12 DIAGNOSIS — M9903 Segmental and somatic dysfunction of lumbar region: Secondary | ICD-10-CM | POA: Diagnosis not present

## 2022-01-12 DIAGNOSIS — M9902 Segmental and somatic dysfunction of thoracic region: Secondary | ICD-10-CM | POA: Diagnosis not present

## 2022-01-16 DIAGNOSIS — E785 Hyperlipidemia, unspecified: Secondary | ICD-10-CM | POA: Diagnosis not present

## 2022-01-16 DIAGNOSIS — T466X5A Adverse effect of antihyperlipidemic and antiarteriosclerotic drugs, initial encounter: Secondary | ICD-10-CM | POA: Diagnosis not present

## 2022-01-16 DIAGNOSIS — Z125 Encounter for screening for malignant neoplasm of prostate: Secondary | ICD-10-CM | POA: Diagnosis not present

## 2022-01-16 DIAGNOSIS — K219 Gastro-esophageal reflux disease without esophagitis: Secondary | ICD-10-CM | POA: Diagnosis not present

## 2022-01-16 DIAGNOSIS — Z79899 Other long term (current) drug therapy: Secondary | ICD-10-CM | POA: Diagnosis not present

## 2022-01-16 DIAGNOSIS — I10 Essential (primary) hypertension: Secondary | ICD-10-CM | POA: Diagnosis not present

## 2022-01-16 DIAGNOSIS — E1159 Type 2 diabetes mellitus with other circulatory complications: Secondary | ICD-10-CM | POA: Diagnosis not present

## 2022-01-16 DIAGNOSIS — F419 Anxiety disorder, unspecified: Secondary | ICD-10-CM | POA: Diagnosis not present

## 2022-01-18 ENCOUNTER — Other Ambulatory Visit: Payer: Self-pay | Admitting: Cardiology

## 2022-01-21 DIAGNOSIS — M5451 Vertebrogenic low back pain: Secondary | ICD-10-CM | POA: Diagnosis not present

## 2022-01-23 DIAGNOSIS — Z6827 Body mass index (BMI) 27.0-27.9, adult: Secondary | ICD-10-CM | POA: Diagnosis not present

## 2022-01-23 DIAGNOSIS — E785 Hyperlipidemia, unspecified: Secondary | ICD-10-CM | POA: Diagnosis not present

## 2022-01-23 DIAGNOSIS — I10 Essential (primary) hypertension: Secondary | ICD-10-CM | POA: Diagnosis not present

## 2022-01-23 DIAGNOSIS — E1169 Type 2 diabetes mellitus with other specified complication: Secondary | ICD-10-CM | POA: Diagnosis not present

## 2022-01-23 DIAGNOSIS — I471 Supraventricular tachycardia: Secondary | ICD-10-CM | POA: Diagnosis not present

## 2022-01-23 DIAGNOSIS — G720 Drug-induced myopathy: Secondary | ICD-10-CM | POA: Diagnosis not present

## 2022-03-07 ENCOUNTER — Other Ambulatory Visit: Payer: Self-pay | Admitting: Cardiology

## 2022-03-16 ENCOUNTER — Other Ambulatory Visit: Payer: Self-pay | Admitting: Internal Medicine

## 2022-03-16 DIAGNOSIS — K862 Cyst of pancreas: Secondary | ICD-10-CM

## 2022-03-20 ENCOUNTER — Other Ambulatory Visit: Payer: Self-pay | Admitting: Cardiology

## 2022-03-30 ENCOUNTER — Ambulatory Visit
Admission: RE | Admit: 2022-03-30 | Discharge: 2022-03-30 | Disposition: A | Discharge status: Home / Self Care | Payer: Medicare PPO | Source: Ambulatory Visit | Attending: Internal Medicine | Admitting: Internal Medicine

## 2022-03-30 DIAGNOSIS — K862 Cyst of pancreas: Secondary | ICD-10-CM

## 2022-03-30 DIAGNOSIS — K8020 Calculus of gallbladder without cholecystitis without obstruction: Secondary | ICD-10-CM | POA: Diagnosis not present

## 2022-03-30 DIAGNOSIS — R935 Abnormal findings on diagnostic imaging of other abdominal regions, including retroperitoneum: Secondary | ICD-10-CM | POA: Diagnosis not present

## 2022-03-30 DIAGNOSIS — Q446 Cystic disease of liver: Secondary | ICD-10-CM | POA: Diagnosis not present

## 2022-04-07 ENCOUNTER — Other Ambulatory Visit: Payer: Self-pay | Admitting: Cardiology

## 2022-04-07 MED ORDER — METOPROLOL SUCCINATE ER 50 MG PO TB24: 50.0000 mg | ORAL_TABLET | Freq: Every day | ORAL | 0 refills | Status: DC

## 2022-04-28 ENCOUNTER — Other Ambulatory Visit: Payer: Self-pay | Admitting: Cardiology

## 2022-04-29 DIAGNOSIS — E118 Type 2 diabetes mellitus with unspecified complications: Secondary | ICD-10-CM | POA: Diagnosis not present

## 2022-05-07 DIAGNOSIS — I10 Essential (primary) hypertension: Secondary | ICD-10-CM | POA: Diagnosis not present

## 2022-05-07 DIAGNOSIS — R7309 Other abnormal glucose: Secondary | ICD-10-CM | POA: Diagnosis not present

## 2022-05-07 DIAGNOSIS — E1169 Type 2 diabetes mellitus with other specified complication: Secondary | ICD-10-CM | POA: Diagnosis not present

## 2022-06-16 ENCOUNTER — Ambulatory Visit: Payer: Medicare PPO | Attending: Cardiology | Admitting: Cardiology

## 2022-06-16 ENCOUNTER — Encounter: Payer: Self-pay | Admitting: Cardiology

## 2022-06-16 VITALS — BP 100/68 | HR 82 | Ht 71.0 in | Wt 203.0 lb

## 2022-06-16 DIAGNOSIS — I10 Essential (primary) hypertension: Secondary | ICD-10-CM

## 2022-06-16 DIAGNOSIS — I471 Supraventricular tachycardia, unspecified: Secondary | ICD-10-CM

## 2022-06-16 MED ORDER — METOPROLOL SUCCINATE ER 25 MG PO TB24: 25.0000 mg | ORAL_TABLET | Freq: Two times a day (BID) | ORAL | 2 refills | Status: DC

## 2022-06-16 NOTE — Progress Notes (Signed)
Electrophysiology Office Note   Date:  06/16/2022   ID:  Edward Baugher., DOB Dec 24, 1950, MRN [id number]  PCP:  Asencion Noble, MD  Cardiologist:  Domenic Polite Primary Electrophysiologist:  Latori Beggs Meredith Leeds, MD    Chief Complaint: SVT   History of Present Illness: Edward Armstrong. is a 71 y.o. male who is being seen today for the evaluation of SVT at the request of Asencion Noble, MD. Presenting today for electrophysiology evaluation.  He has a history significant for hypertension, non-insulin-dependent diabetes, SVT.  He wore a cardiac monitor that showed sinus rhythm and rare ectopy.  On 06/10/2021 developed rapid tachycardia with chest pain.  He presented emergency room with a heart rate of 173 this continuously terminated.  He was started on diltiazem.  He had an EP study with short episodes of tachycardia that it occurred during pacing but would stop when pacing was stopped, thus no tachycardia testing was performed.  He was switched to Toprol-XL.  He wore a cardiac monitor that showed no significant arrhythmia.  Today, denies symptoms of palpitations, chest pain, shortness of breath, orthopnea, PND, lower extremity edema, claudication, dizziness, presyncope, syncope, bleeding, or neurologic sequela. The patient is tolerating medications without difficulties.  He has palpitations that occur more towards the end of the day.  They are not overly worrisome and are short-lived.  Despite that, he is able to do all of his daily activities without complaint.    Past Medical History:  Diagnosis Date   Allergy    SEASONAL   Anxiety    Cataract    BEGINNING,BILATERAL   Diabetes mellitus without complication ([place])    GERD (gastroesophageal reflux disease)    Hyperlipidemia    Hypertension    SVT (supraventricular tachycardia)    Past Surgical History:  Procedure Laterality Date   APPENDECTOMY     CARPAL TUNNEL RELEASE Left 04/01/2021   Procedure: LEFT CARPAL TUNNEL RELEASE;  Surgeon:  Daryll Brod, MD;  Location: [facility];  Service: Orthopedics;  Laterality: Left;  MAC and beir block   COLONOSCOPY     EYE SURGERY Right    LASER FOR RETINA TEAR   POLYPECTOMY     SVT ABLATION N/A 08/02/2020   Procedure: SVT ABLATION;  Surgeon: Constance Haw, MD;  Location: [facility];  Service: Cardiovascular;  Laterality: N/A;   TRIGGER FINGER RELEASE Left 04/01/2021   Procedure: RELEASE TRIGGER FINGER/A-1 PULLEY LEFT MIDDLE FINGER;  Surgeon: Daryll Brod, MD;  Location: [facility];  Service: Orthopedics;  Laterality: Left;  MAC and beir block   VASECTOMY       Current Outpatient Medications  Medication Sig Dispense Refill   acetaminophen (TYLENOL) 500 MG tablet Take 1,000 mg by mouth every 6 (six) hours as needed for mild pain or headache.     ALPRAZolam (XANAX) 0.25 MG tablet Take 0.25 mg by mouth in the morning and at bedtime.     aspirin EC 81 MG tablet Take 81 mg by mouth daily.     diltiazem (CARDIZEM) 30 MG tablet Take 1 tablet (30 mg total) by mouth every 6 (six) hours as needed (for elevated heart rates). 30 tablet 1   ezetimibe (ZETIA) 10 MG tablet Take 10 mg by mouth at bedtime.      metFORMIN (GLUCOPHAGE) 1000 MG tablet Take 1,000 mg by mouth 2 (two) times daily with a meal.      metoprolol succinate (TOPROL XL) 25 MG 24 hr tablet  Take 1 tablet (25 mg total) by mouth in the morning and at bedtime. 180 tablet 2   Multiple Vitamins-Minerals (VITAMIN C EFFERVESCENT BLEND PO) Take 1 packet by mouth daily.     omeprazole (PRILOSEC) 40 MG capsule Take 40 mg by mouth daily.     Propylene Glycol (SYSTANE COMPLETE OP) Place 1 drop into both eyes in the morning and at bedtime.     RYBELSUS 7 MG TABS Take 1 tablet by mouth daily.     No current facility-administered medications for this visit.    Allergies:   Macrodantin [nitrofurantoin macrocrystal], Clindamycin/lincomycin, and Septra [sulfamethoxazole-trimethoprim]   Social History:   The patient  reports that he has never smoked. He has been exposed to tobacco smoke. He has never used smokeless tobacco. He reports current alcohol use. He reports that he does not use drugs.   Family History:  The patient's family history includes Diabetes in his mother; Heart attack in his father.   ROS:  Please see the history of present illness.   Otherwise, review of systems is positive for none.   All other systems are reviewed and negative.   PHYSICAL EXAM: VS:  BP 100/68   Pulse 82   Ht _0  (1.803 m)   Wt 203 lb (92.1 kg)   SpO2 98%   BMI 28.31 kg/m  , BMI Body mass index is 28.31 kg/m. GEN: Well nourished, well developed, in no acute distress  HEENT: normal  Neck: no JVD, carotid bruits, or masses Cardiac: RRR; no murmurs, rubs, or gallops,no edema  Respiratory:  clear to auscultation bilaterally, normal work of breathing GI: soft, nontender, nondistended, + BS MS: no deformity or atrophy  Skin: warm and dry Neuro:  Strength and sensation are intact Psych: euthymic mood, full affect  EKG:  EKG is ordered today. Personal review of the ekg ordered shows sinus rhythm   Recent Labs: No results found for requested labs within last 365 days.    Lipid Panel  No results found for: "CHOL", "TRIG", "HDL", "CHOLHDL", "VLDL", "LDLCALC", "LDLDIRECT"   Wt Readings from Last 3 Encounters:  06/16/22 203 lb (92.1 kg)  12/15/21 200 lb (90.7 kg)  12/08/21 200 lb (90.7 kg)      Other studies Reviewed: Additional studies/ records that were reviewed today include: TTE 06/11/20  Review of the above records today demonstrates:   1. Left ventricular ejection fraction, by estimation, is 55 to 60%. The  left ventricle has normal function. The left ventricle has no regional  wall motion abnormalities. There is mild left ventricular hypertrophy.  Left ventricular diastolic parameters  are indeterminate.   2. Right ventricular systolic function is normal. The right ventricular   size is normal. Tricuspid regurgitation signal is inadequate for assessing  PA pressure.   3. The mitral valve is grossly normal. Trivial mitral valve  regurgitation.   4. The aortic valve is tricuspid. Aortic valve regurgitation is not  visualized.   5. The inferior vena cava is normal in size with greater than 50%  respiratory variability, suggesting right atrial pressure of 3 mmHg.  Holter monitor 09/30/2020 personally reviewed Predominant rhythm is normal sinus rhythm Rare supraventricular and ventricular ectopy Reported symptoms correlated with sinus rhythm No significant arrhythmias   ASSESSMENT AND PLAN:  1.  SVT: EP study 08/29/2020 with multiple episodes of SVT that occurred during pacing.  All terminate post pacing.  Has been started on Toprol-XL.  Cardiac monitor 09/30/2020 without significant arrhythmia.  He is continue  to have episodic palpitations.  He Edward Armstrong take his Toprol-XL and cut it in half taking 25 mg twice a day.  Aside from that, no changes.  2.  Hypertension: Currently well controlled  3.  Hyperlipidemia: Continue Zetia per PCP.   Current medicines are reviewed at length with the patient today.   The patient does not have concerns regarding his medicines.  The following changes were made today: None  Labs/ tests ordered today include:  Orders Placed This Encounter  Procedures   EKG 12-Lead     Disposition:   FU 12 months  Signed, Raffi Milstein Meredith Leeds, MD  06/16/2022 11:10 AM     [facility] [address] [place] [address] [phone] (office) [phone] (fax)

## 2022-06-16 NOTE — Patient Instructions (Signed)
Medication Instructions:  Your physician has recommended you make the following change in your medication:  CHANGE the way you take your Toprol -- take 25 mg twice daily  *If you need a refill on your cardiac medications before your next appointment, please call your pharmacy*   Lab Work: None ordered    Testing/Procedures: None ordered   Follow-Up: At [facility], you and your health needs are our priority.  As part of our continuing mission to provide you with exceptional heart care, we have created designated Provider Care Teams.  These Care Teams include your primary Cardiologist (physician) and Advanced Practice Providers (APPs -  Physician Assistants and Nurse Practitioners) who all work together to provide you with the care you need, when you need it.  Your next appointment:   1 year(s)  The format for your next appointment:   In Person  Provider:   Allegra Lai, MD    Thank you for choosing [facility]!!   Trinidad Curet, RN [phone]  Other Instructions    Important Information About Sugar

## 2022-06-19 ENCOUNTER — Ambulatory Visit: Payer: Medicare PPO | Admitting: Cardiology

## 2022-07-01 DIAGNOSIS — Z23 Encounter for immunization: Secondary | ICD-10-CM | POA: Diagnosis not present

## 2022-07-03 DIAGNOSIS — M546 Pain in thoracic spine: Secondary | ICD-10-CM | POA: Diagnosis not present

## 2022-07-03 DIAGNOSIS — M5451 Vertebrogenic low back pain: Secondary | ICD-10-CM | POA: Diagnosis not present

## 2022-07-13 DIAGNOSIS — M546 Pain in thoracic spine: Secondary | ICD-10-CM | POA: Diagnosis not present

## 2022-09-02 DIAGNOSIS — E119 Type 2 diabetes mellitus without complications: Secondary | ICD-10-CM | POA: Diagnosis not present

## 2022-09-03 ENCOUNTER — Other Ambulatory Visit: Payer: Self-pay | Admitting: Cardiology

## 2022-09-03 DIAGNOSIS — E118 Type 2 diabetes mellitus with unspecified complications: Secondary | ICD-10-CM | POA: Diagnosis not present

## 2022-09-08 DIAGNOSIS — I10 Essential (primary) hypertension: Secondary | ICD-10-CM | POA: Diagnosis not present

## 2022-09-08 DIAGNOSIS — E1169 Type 2 diabetes mellitus with other specified complication: Secondary | ICD-10-CM | POA: Diagnosis not present

## 2022-09-08 DIAGNOSIS — H6120 Impacted cerumen, unspecified ear: Secondary | ICD-10-CM | POA: Diagnosis not present

## 2022-09-08 DIAGNOSIS — R7309 Other abnormal glucose: Secondary | ICD-10-CM | POA: Diagnosis not present

## 2022-09-15 IMAGING — MR MR MRCP
9 of 11 series · 39 of 48 positions shown · non-contrast
Comparison: November 17, 2020.

CLINICAL DATA: Follow-up cyst on pancreas, prior imaging from Thursday October, 2020 with small cystic area in the pancreatic tail discovered at
that time.

EXAM:
MRI ABDOMEN WITHOUT CONTRAST  (INCLUDING MRCP)
TECHNIQUE: Multiplanar multisequence MR imaging of the abdomen was performed.
Heavily T2-weighted images of the biliary and pancreatic ducts were
obtained, and three-dimensional MRCP images were rendered by post
processing.

[Series 3: T2 · coronal · 5.0mm · 1.56mm/px · 2 of 35 slices shown (1 of 3)]
[im 1/35]
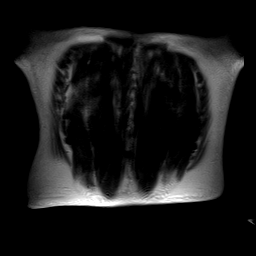
[im 35/35]
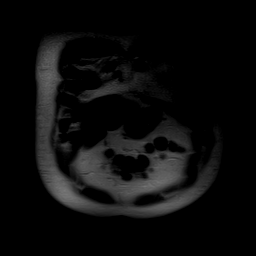

[Series 4: T2 · axial · 6.0mm · 1.56mm/px · z∈[-128,+113]mm · 3 of 36 slices shown (2 of 3)]
[im 1/36]
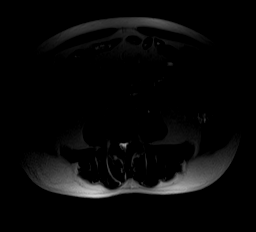
[im 18/36]
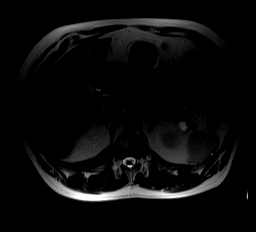
[im 36/36]
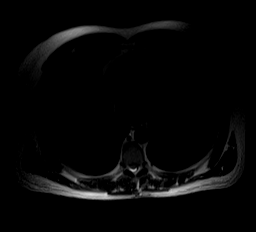

[Series 5: axial tru fisp · axial · 5.0mm · 1.56mm/px · z∈[-134,+118]mm · 4 of 43 slices shown]
[im 1/43]
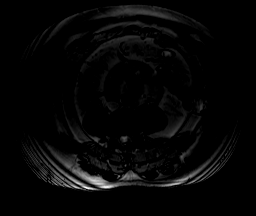
[im 15/43]
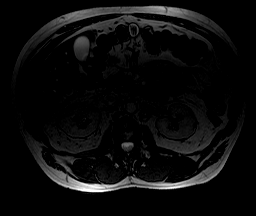
[im 29/43]
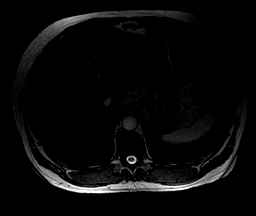
[im 43/43]
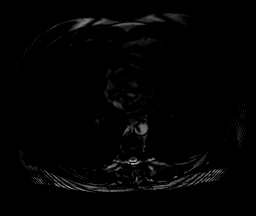

[Series 6: axial in out · axial · 6.0mm · 0.78mm/px · z∈[-125,+110]mm · 6 of 70 slices shown]
[im 1/70]
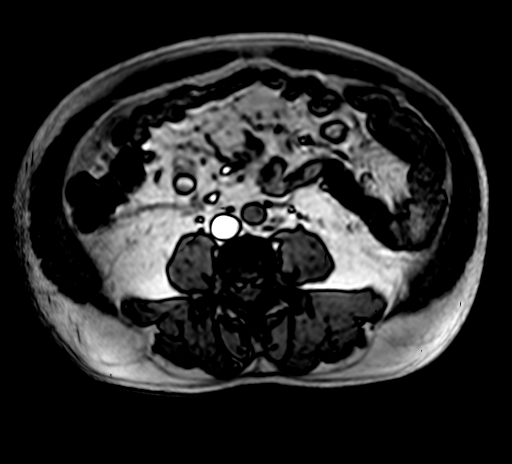
[im 14/70]
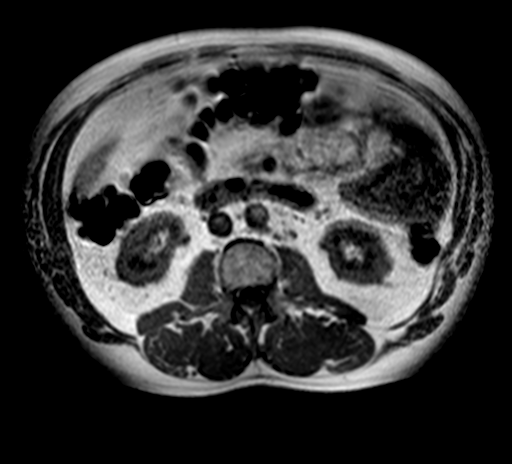
[im 28/70]
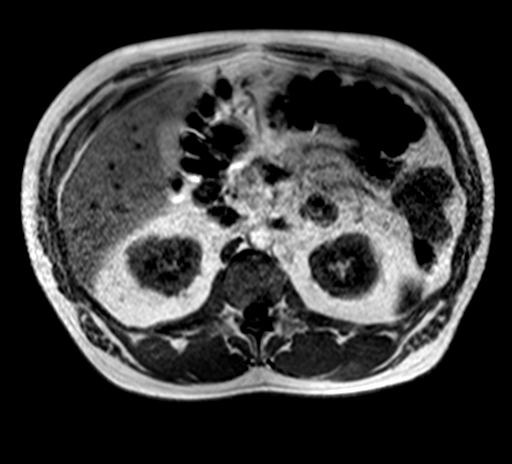
[im 42/70]
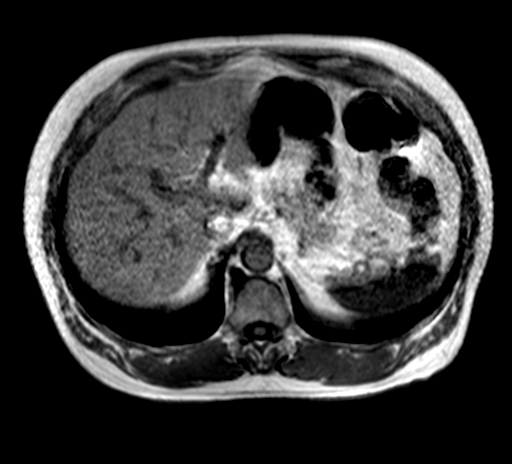
[im 56/70]
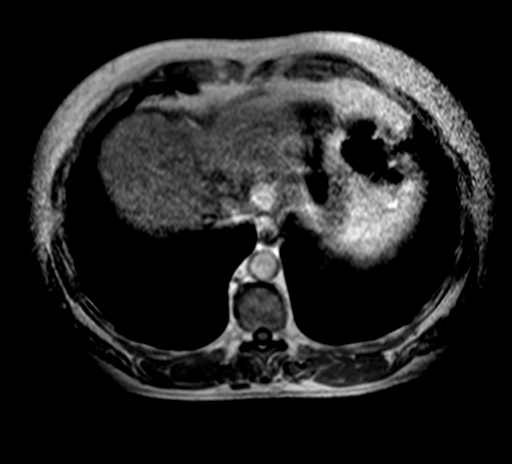
[im 70/70]
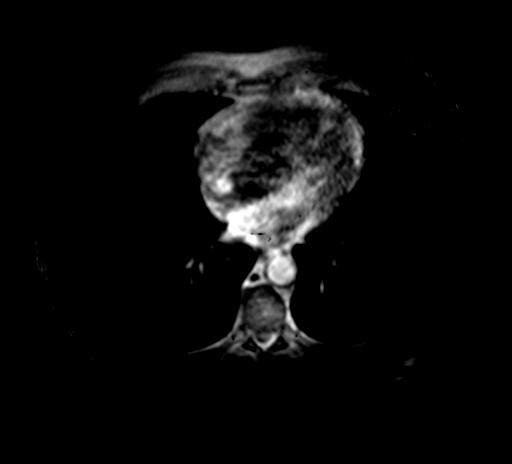

[Series 7: T1 dynamic · axial · non-contrast · 2.5mm · 0.70mm/px · z∈[-116,+101]mm · 8 of 88 slices shown]
[im 1/88]
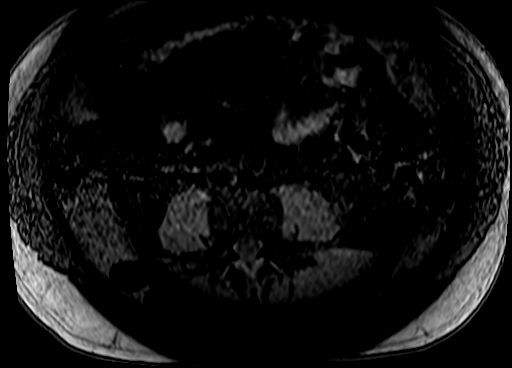
[im 13/88]
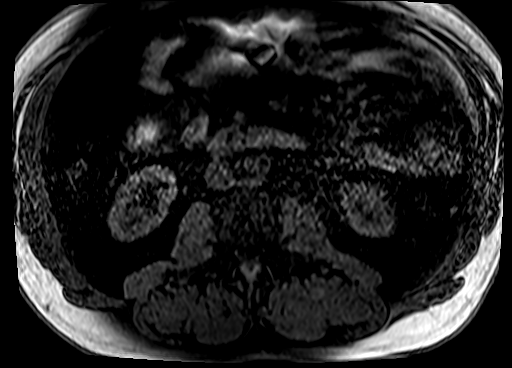
[im 25/88]
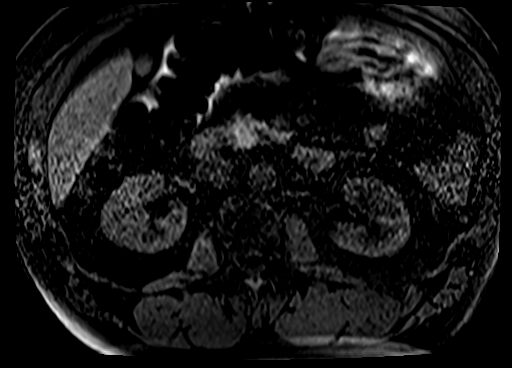
[im 38/88]
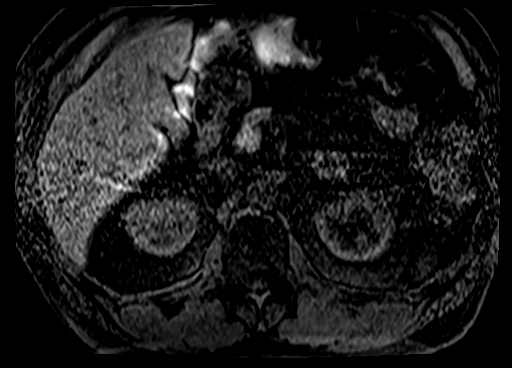
[im 50/88]
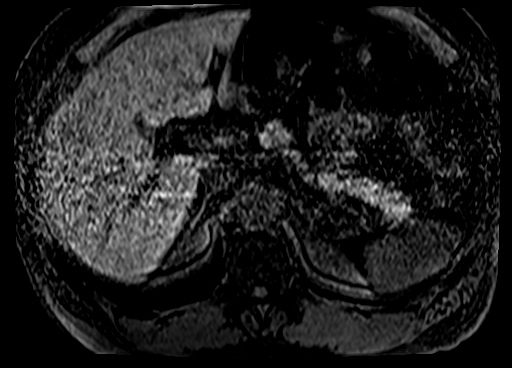
[im 63/88]
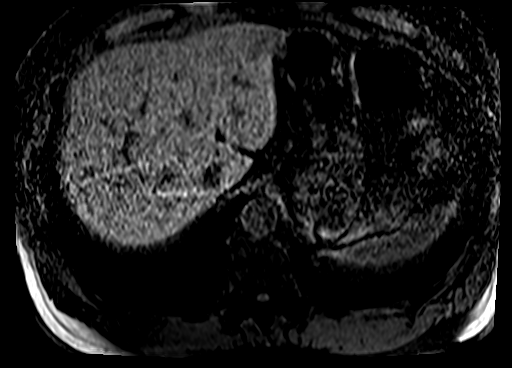
[im 75/88]
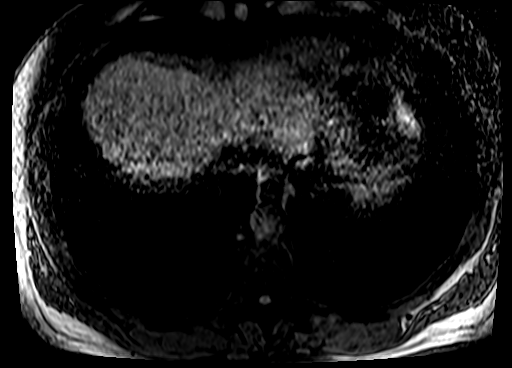
[im 88/88]
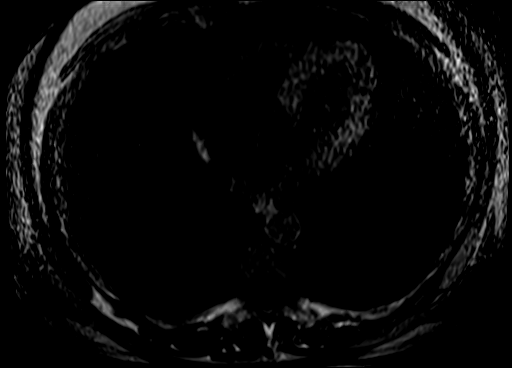

[Series 10: T2 · axial · 6.0mm · 0.78mm/px · z∈[-89,+153]mm · 3 of 36 slices shown (3 of 3)]
[im 1/36]
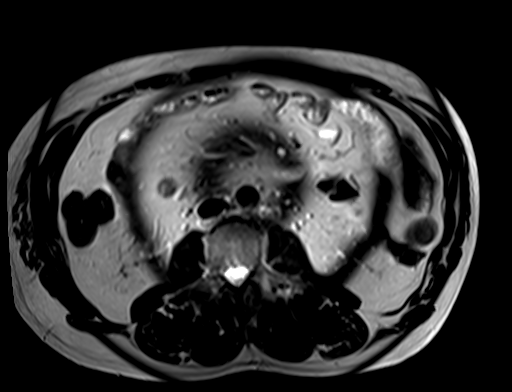
[im 18/36]
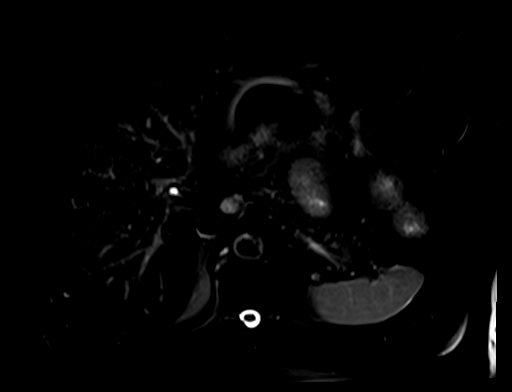
[im 36/36]
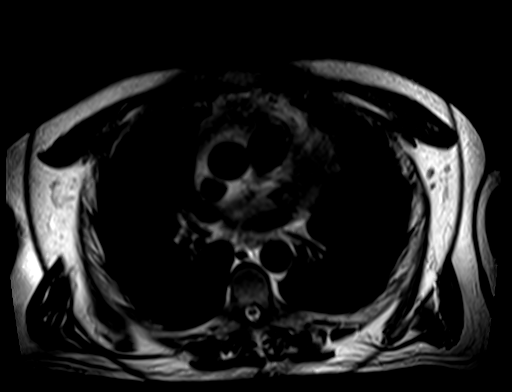

[Series 13: ep2d_diff_b50_500_800_p2-resp · 1 of 6 slices shown]
[im 1/6]
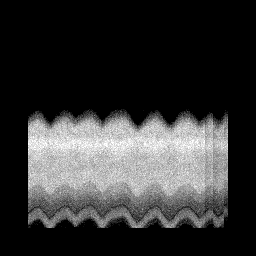

[Series 14: ep2d_diff_b50_500_800_p2 · axial · 6.0mm · 2.08mm/px · z∈[-103,+167]mm · 8 of 120 slices shown]
[im 1/120]
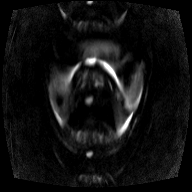
[im 24/120]
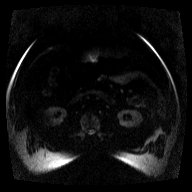
[im 36/120]
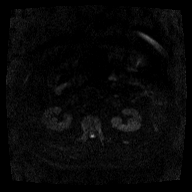
[im 48/120]
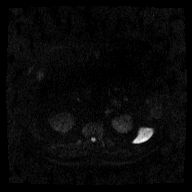
[im 72/120]
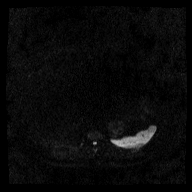
[im 84/120]
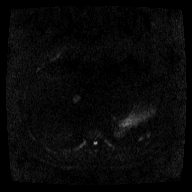
[im 96/120]
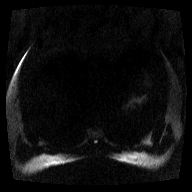
[im 120/120]
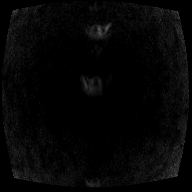

[Series 15: ep2d_diff_b50_500_800_p2_adc · axial · 6.0mm · 2.08mm/px · z∈[-103,+167]mm · 4 of 40 slices shown]
[im 1/40]
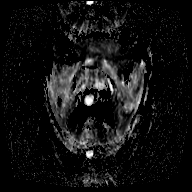
[im 14/40]
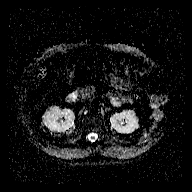
[im 27/40]
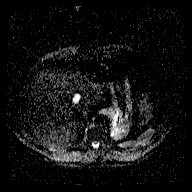
[im 40/40]
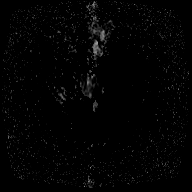

[39 of 48 positions shown; findings below may reference images not displayed]

FINDINGS: Lower chest: No effusion or consolidation, limited assessment of the
lung bases on MRI.

Hepatobiliary: LEFT hepatic lobe cyst, not changed since previous
imaging 16 mm greatest size. Liver with smooth contours.
Cholelithiasis, single gallstone in the dependent gallbladder. No
biliary duct dilation. No pericholecystic stranding. Liver otherwise
unremarkable on noncontrast MRI.

Pancreas: Pancreas displays normal intrinsic T1 signal aside from
the cystic area in the tail of the pancreas that measures 1.9 cm
greatest axial dimension, as compared to 1.7 cm on the previous exam
from Thursday October, 2020.

No main duct dilation. The mildly limited assessment due to lack of
contrast but no overt nodularity internally of this lesion on
today's study.

Spleen:  Unremarkable.

Adrenals/Urinary Tract: No perinephric stranding or hydronephrosis.
No visible lesion on noncontrast imaging.

Stomach/Bowel: No acute gastrointestinal process to the extent
evaluated on this abdominal MRI.

Vascular/Lymphatic: Normal caliber of abdominal vessels. Limited
assessment on noncontrast imaging.

Other:  No ascites.

Musculoskeletal: No suspicious bone lesions identified.
IMPRESSION: 1. 1.9 cm cystic area in the tail of the pancreas, mildly limited
assessment due to lack of contrast but no overt nodularity
internally of this lesion on today's study. No definitive signs of
ductal communication are noted. No main duct dilation is present.
This is very slightly enlarged since the prior study. Consider
six-month follow-up with MRI/MRCP with and without intravenous
contrast based on current guidelines given lack of ductal
communication. Mucinous cystic neoplasm of the pancreas/IPMN are
considered. No overt nodularity internally at this time on this
noncontrast evaluation.
2. Cholelithiasis.  No biliary duct dilation.
3. LEFT hepatic lobe cyst, not changed since previous imaging.

## 2022-09-28 ENCOUNTER — Other Ambulatory Visit: Payer: Self-pay | Admitting: Internal Medicine

## 2022-09-28 DIAGNOSIS — K862 Cyst of pancreas: Secondary | ICD-10-CM

## 2022-10-10 ENCOUNTER — Ambulatory Visit
Admission: RE | Admit: 2022-10-10 | Discharge: 2022-10-10 | Disposition: A | Discharge status: Home / Self Care | Payer: Medicare PPO | Source: Ambulatory Visit | Attending: Family Medicine | Admitting: Family Medicine

## 2022-10-10 VITALS — BP 148/83 | HR 104 | Temp 99.1°F | Resp 20

## 2022-10-10 DIAGNOSIS — Z1152 Encounter for screening for COVID-19: Secondary | ICD-10-CM | POA: Insufficient documentation

## 2022-10-10 DIAGNOSIS — R509 Fever, unspecified: Secondary | ICD-10-CM | POA: Diagnosis not present

## 2022-10-10 DIAGNOSIS — J029 Acute pharyngitis, unspecified: Secondary | ICD-10-CM

## 2022-10-10 LAB — POCT INFLUENZA A/B
Influenza A, POC: NEGATIVE
Influenza B, POC: NEGATIVE

## 2022-10-10 LAB — POCT RAPID STREP A (OFFICE): Rapid Strep A Screen: NEGATIVE

## 2022-10-10 MED ORDER — LIDOCAINE VISCOUS HCL 2 % MT SOLN: 10.0000 mL | OROMUCOSAL | 0 refills | Status: AC | PRN

## 2022-10-10 NOTE — ED Provider Notes (Signed)
[facility]    CSN: [id number] Arrival date & time: 10/10/22  1040      History   Chief Complaint Chief Complaint  Patient presents with   Fever    Headache, weakness - Entered by patient   Sore Throat    HPI Edward Armstrong. is a 72 y.o. male.   Patient presenting today with 3-day history of fever, sore throat, swollen lymph nodes in his neck, fatigue.  Denies cough, congestion, chest pain, shortness of breath, abdominal pain, nausea vomiting or diarrhea.  So far trying over-the-counter remedies with minimal relief.  No known sick contacts recently.   Past Medical History:  Diagnosis Date   Allergy    SEASONAL   Anxiety    Cataract    BEGINNING,BILATERAL   Diabetes mellitus without complication (HCC)    GERD (gastroesophageal reflux disease)    Hyperlipidemia    Hypertension    SVT (supraventricular tachycardia)    Patient Active Problem List   Diagnosis Date Noted   SVT (supraventricular tachycardia) 06/10/2020   Palpitations 06/10/2020   Chest pressure 06/10/2020   Essential hypertension 06/10/2020   Hyperlipidemia 06/10/2020   Diabetes mellitus type 2, noninsulin dependent ([place]) 06/10/2020   Left shoulder pain 02/21/2013   Rotator cuff syndrome of left shoulder 02/21/2013    Past Surgical History:  Procedure Laterality Date   APPENDECTOMY     CARPAL TUNNEL RELEASE Left 04/01/2021   Procedure: LEFT CARPAL TUNNEL RELEASE;  Surgeon: Daryll Brod, MD;  Location: [place];  Service: Orthopedics;  Laterality: Left;  MAC and beir block   COLONOSCOPY     EYE SURGERY Right    LASER FOR RETINA TEAR   POLYPECTOMY     SVT ABLATION N/A 08/02/2020   Procedure: SVT ABLATION;  Surgeon: Constance Haw, MD;  Location: [facility];  Service: Cardiovascular;  Laterality: N/A;   TRIGGER FINGER RELEASE Left 04/01/2021   Procedure: RELEASE TRIGGER FINGER/A-1 PULLEY LEFT MIDDLE FINGER;  Surgeon: Daryll Brod, MD;  Location: [place];  Service: Orthopedics;  Laterality: Left;  MAC and beir block   VASECTOMY         Home Medications    Prior to Admission medications   Medication Sig Start Date End Date Taking? Authorizing Provider  lidocaine (XYLOCAINE) 2 % solution Use as directed 10 mLs in the mouth or throat every 3 (three) hours as needed for mouth pain. 10/10/22  Yes Volney American, PA-C  acetaminophen (TYLENOL) 500 MG tablet Take 1,000 mg by mouth every 6 (six) hours as needed for mild pain or headache.    [provider]  ALPRAZolam Duanne Moron) 0.25 MG tablet Take 0.25 mg by mouth in the morning and at bedtime.    [provider]  aspirin EC 81 MG tablet Take 81 mg by mouth daily.    [provider]  diltiazem (CARDIZEM) 30 MG tablet Take 1 tablet (30 mg total) by mouth every 6 (six) hours as needed (for elevated heart rates). 07/12/20   Camnitz, Ocie Doyne, MD  ezetimibe (ZETIA) 10 MG tablet Take 10 mg by mouth at bedtime.     [provider]  metFORMIN (GLUCOPHAGE) 1000 MG tablet Take 1,000 mg by mouth 2 (two) times daily with a meal.     [provider]  metoprolol succinate (TOPROL XL) 25 MG 24 hr tablet Take 1 tablet (25 mg total) by mouth in the morning and at bedtime. 06/16/22  Camnitz, Will Hassell Done, MD  Multiple Vitamins-Minerals (VITAMIN C EFFERVESCENT BLEND PO) Take 1 packet by mouth daily.    [provider]  omeprazole (PRILOSEC) 40 MG capsule Take 40 mg by mouth daily.    [provider]  Propylene Glycol (SYSTANE COMPLETE OP) Place 1 drop into both eyes in the morning and at bedtime.    [provider]  RYBELSUS 7 MG TABS Take 1 tablet by mouth daily. 03/25/22   [provider]    Family History Family History  Problem Relation Age of Onset   Diabetes Mother    Heart attack Father    Colon cancer Neg Hx    Esophageal cancer Neg Hx    Liver cancer Neg Hx    Pancreatic cancer Neg Hx    Rectal  cancer Neg Hx    Stomach cancer Neg Hx    Colon polyps Neg Hx    Crohn's disease Neg Hx     Social History Social History   Tobacco Use   Smoking status: Never    Passive exposure: Past ("long time ago")   Smokeless tobacco: Never  Vaping Use   Vaping Use: Never used  Substance Use Topics   Alcohol use: Yes    Comment: social   Drug use: No     Allergies   Macrodantin [nitrofurantoin macrocrystal], Clindamycin/lincomycin, and Septra [sulfamethoxazole-trimethoprim]   Review of Systems Review of Systems Per HPI  Physical Exam Triage Vital Signs ED Triage Vitals  Enc Vitals Group     BP 10/10/22 1120 (!) 148/83     Pulse Rate 10/10/22 1120 (!) 104     Resp 10/10/22 1120 20     Temp 10/10/22 1120 99.1 F (37.3 C)     Temp Source 10/10/22 1120 Oral     SpO2 10/10/22 1120 93 %     Weight --      Height --      Head Circumference --      Peak Flow --      Pain Score 10/10/22 1121 8     Pain Loc --      Pain Edu? --      Excl. in [place]? --    No data found.  Updated Vital Signs BP (!) 148/83 (BP Location: Right Arm)   Pulse (!) 104   Temp 99.1 F (37.3 C) (Oral)   Resp 20   SpO2 93%   Visual Acuity Right Eye Distance:   Left Eye Distance:   Bilateral Distance:    Right Eye Near:   Left Eye Near:    Bilateral Near:     Physical Exam Vitals and nursing note reviewed.  Constitutional:      Appearance: He is well-developed.  HENT:     Head: Atraumatic.     Right Ear: External ear normal.     Left Ear: External ear normal.     Nose: Nose normal.     Mouth/Throat:     Pharynx: Posterior oropharyngeal erythema present. No oropharyngeal exudate.  Eyes:     Conjunctiva/sclera: Conjunctivae normal.     Pupils: Pupils are equal, round, and reactive to light.  Cardiovascular:     Rate and Rhythm: Normal rate and regular rhythm.  Pulmonary:     Effort: Pulmonary effort is normal. No respiratory distress.     Breath sounds: No wheezing or rales.   Musculoskeletal:        General: Normal range of motion.     Cervical  back: Normal range of motion and neck supple.  Lymphadenopathy:     Cervical: No cervical adenopathy.  Skin:    General: Skin is warm and dry.  Neurological:     Mental Status: He is alert and oriented to person, place, and time.  Psychiatric:        Behavior: Behavior normal.    UC Treatments / Results  Labs (all labs ordered are listed, but only abnormal results are displayed) Labs Reviewed  SARS CORONAVIRUS 2 (TAT 6-24 HRS)  CULTURE, GROUP A STREP [facility])  POCT RAPID STREP A (OFFICE)  POCT INFLUENZA A/B   EKG   Radiology No results found.  Procedures Procedures (including critical care time)  Medications Ordered in UC Medications - No data to display  Initial Impression / Assessment and Plan / UC Course  I have reviewed the triage vital signs and the nursing notes.  Pertinent labs & imaging results that were available during my care of the patient were reviewed by me and considered in my medical decision making (see chart for details).     Minimally tachycardic and hypertensive in triage, otherwise vital signs and exam reassuring.  Rapid strep negative, rapid flu negative, throat culture and COVID testing pending.  Discussed viscous lidocaine for sore throat, supportive over-the-counter medications, home care.  Return for worsening symptoms.  Final Clinical Impressions(s) / UC Diagnoses   Final diagnoses:  Acute pharyngitis, unspecified etiology  Fever, unspecified     Discharge Instructions      Your rapid strep test was negative today.  I have sent out for a throat culture just to be sure.  Your flu test was also negative today so we have sent out for a COVID test to rule this out.  This should be back in the morning.  Take over-the-counter pain relievers, cold and congestion medications, nasal sprays to help with any postnasal drainage and I have sent over a numbing liquid for you to  gargle for your throat pain.  Follow-up for significantly worsening symptoms.    ED Prescriptions     Medication Sig Dispense Auth. Provider   lidocaine (XYLOCAINE) 2 % solution Use as directed 10 mLs in the mouth or throat every 3 (three) hours as needed for mouth pain. 100 mL Volney American, [place]      PDMP not reviewed this encounter.   Volney American, [place] 10/10/22 1339

## 2022-10-10 NOTE — Discharge Instructions (Addendum)
Your rapid strep test was negative today.  I have sent out for a throat culture just to be sure.  Your flu test was also negative today so we have sent out for a COVID test to rule this out.  This should be back in the morning.  Take over-the-counter pain relievers, cold and congestion medications, nasal sprays to help with any postnasal drainage and I have sent over a numbing liquid for you to gargle for your throat pain.  Follow-up for significantly worsening symptoms.

## 2022-10-10 NOTE — ED Triage Notes (Signed)
Pt reports fever, swollen throat glans, sore throat x 3 days

## 2022-10-11 LAB — SARS CORONAVIRUS 2 (TAT 6-24 HRS): SARS Coronavirus 2: NEGATIVE

## 2022-10-12 ENCOUNTER — Telehealth (HOSPITAL_COMMUNITY): Payer: Self-pay | Admitting: Emergency Medicine

## 2022-10-12 LAB — CULTURE, GROUP A STREP (THRC)

## 2022-10-12 MED ORDER — AZITHROMYCIN 250 MG PO TABS: 250.0000 mg | ORAL_TABLET | Freq: Every day | ORAL | 0 refills | Status: DC

## 2022-10-15 ENCOUNTER — Other Ambulatory Visit (HOSPITAL_COMMUNITY): Payer: Self-pay | Admitting: Internal Medicine

## 2022-10-15 DIAGNOSIS — E041 Nontoxic single thyroid nodule: Secondary | ICD-10-CM

## 2022-10-26 ENCOUNTER — Ambulatory Visit (HOSPITAL_COMMUNITY)
Admission: RE | Admit: 2022-10-26 | Discharge: 2022-10-26 | Disposition: A | Discharge status: Home / Self Care | Payer: Medicare PPO | Source: Ambulatory Visit | Attending: Internal Medicine | Admitting: Internal Medicine

## 2022-10-26 DIAGNOSIS — E042 Nontoxic multinodular goiter: Secondary | ICD-10-CM | POA: Diagnosis not present

## 2022-10-26 DIAGNOSIS — E041 Nontoxic single thyroid nodule: Secondary | ICD-10-CM | POA: Diagnosis not present

## 2022-10-31 ENCOUNTER — Ambulatory Visit
Admission: RE | Admit: 2022-10-31 | Discharge: 2022-10-31 | Disposition: A | Discharge status: Home / Self Care | Payer: Medicare PPO | Source: Ambulatory Visit | Attending: Internal Medicine | Admitting: Internal Medicine

## 2022-10-31 ENCOUNTER — Other Ambulatory Visit: Payer: Self-pay | Admitting: Internal Medicine

## 2022-10-31 DIAGNOSIS — K828 Other specified diseases of gallbladder: Secondary | ICD-10-CM | POA: Diagnosis not present

## 2022-10-31 DIAGNOSIS — K760 Fatty (change of) liver, not elsewhere classified: Secondary | ICD-10-CM | POA: Diagnosis not present

## 2022-10-31 DIAGNOSIS — K862 Cyst of pancreas: Secondary | ICD-10-CM

## 2022-10-31 DIAGNOSIS — R935 Abnormal findings on diagnostic imaging of other abdominal regions, including retroperitoneum: Secondary | ICD-10-CM | POA: Diagnosis not present

## 2023-02-01 DIAGNOSIS — F419 Anxiety disorder, unspecified: Secondary | ICD-10-CM | POA: Diagnosis not present

## 2023-02-01 DIAGNOSIS — I10 Essential (primary) hypertension: Secondary | ICD-10-CM | POA: Diagnosis not present

## 2023-02-01 DIAGNOSIS — E118 Type 2 diabetes mellitus with unspecified complications: Secondary | ICD-10-CM | POA: Diagnosis not present

## 2023-02-01 DIAGNOSIS — E785 Hyperlipidemia, unspecified: Secondary | ICD-10-CM | POA: Diagnosis not present

## 2023-02-01 DIAGNOSIS — Z125 Encounter for screening for malignant neoplasm of prostate: Secondary | ICD-10-CM | POA: Diagnosis not present

## 2023-02-01 DIAGNOSIS — Z79899 Other long term (current) drug therapy: Secondary | ICD-10-CM | POA: Diagnosis not present

## 2023-02-01 DIAGNOSIS — K219 Gastro-esophageal reflux disease without esophagitis: Secondary | ICD-10-CM | POA: Diagnosis not present

## 2023-02-01 DIAGNOSIS — T466X5A Adverse effect of antihyperlipidemic and antiarteriosclerotic drugs, initial encounter: Secondary | ICD-10-CM | POA: Diagnosis not present

## 2023-02-08 DIAGNOSIS — Z0001 Encounter for general adult medical examination with abnormal findings: Secondary | ICD-10-CM | POA: Diagnosis not present

## 2023-02-08 DIAGNOSIS — R7309 Other abnormal glucose: Secondary | ICD-10-CM | POA: Diagnosis not present

## 2023-02-08 DIAGNOSIS — I471 Supraventricular tachycardia, unspecified: Secondary | ICD-10-CM | POA: Diagnosis not present

## 2023-02-08 DIAGNOSIS — I10 Essential (primary) hypertension: Secondary | ICD-10-CM | POA: Diagnosis not present

## 2023-02-08 DIAGNOSIS — K2100 Gastro-esophageal reflux disease with esophagitis, without bleeding: Secondary | ICD-10-CM | POA: Diagnosis not present

## 2023-02-08 DIAGNOSIS — T466X5A Adverse effect of antihyperlipidemic and antiarteriosclerotic drugs, initial encounter: Secondary | ICD-10-CM | POA: Diagnosis not present

## 2023-05-15 ENCOUNTER — Other Ambulatory Visit: Payer: Self-pay | Admitting: Cardiology

## 2023-05-24 ENCOUNTER — Ambulatory Visit
Admission: RE | Admit: 2023-05-24 | Discharge: 2023-05-24 | Disposition: A | Discharge status: Home / Self Care | Payer: Medicare PPO | Source: Ambulatory Visit | Attending: Nurse Practitioner | Admitting: Nurse Practitioner

## 2023-05-24 VITALS — BP 149/84 | HR 114 | Temp 101.1°F | Resp 18

## 2023-05-24 DIAGNOSIS — J069 Acute upper respiratory infection, unspecified: Secondary | ICD-10-CM | POA: Diagnosis not present

## 2023-05-24 DIAGNOSIS — Z1152 Encounter for screening for COVID-19: Secondary | ICD-10-CM | POA: Diagnosis not present

## 2023-05-24 DIAGNOSIS — R051 Acute cough: Secondary | ICD-10-CM | POA: Diagnosis not present

## 2023-05-24 DIAGNOSIS — B9789 Other viral agents as the cause of diseases classified elsewhere: Secondary | ICD-10-CM | POA: Diagnosis not present

## 2023-05-24 LAB — POCT INFLUENZA A/B
Influenza A, POC: NEGATIVE
Influenza B, POC: NEGATIVE

## 2023-05-24 MED ORDER — FLUTICASONE PROPIONATE 50 MCG/ACT NA SUSP: 2.0000 | Freq: Every day | NASAL | 0 refills | Status: AC

## 2023-05-24 MED ORDER — ACETAMINOPHEN 325 MG PO TABS
650.0000 mg | ORAL_TABLET | Freq: Once | ORAL | Status: AC
  Administered 2023-05-24: 650 mg via ORAL

## 2023-05-24 MED ORDER — BENZONATATE 100 MG PO CAPS: 100.0000 mg | ORAL_CAPSULE | Freq: Three times a day (TID) | ORAL | 0 refills | Status: DC

## 2023-05-24 MED ORDER — CETIRIZINE HCL 10 MG PO TABS: 10.0000 mg | ORAL_TABLET | Freq: Every day | ORAL | 0 refills | Status: AC

## 2023-05-24 NOTE — ED Provider Notes (Signed)
[facility]    CSN: [id number] Arrival date & time: 05/24/23  1453      History   Chief Complaint Chief Complaint  Patient presents with   Cough    Entered by patient   Fever    HPI Edward Sobh. is a 72 y.o. male.   The history is provided by the patient.   Patient presents for complaints of fever, chills, body aches, headache, nasal congestion, sore throat, and cough.  Patient states symptoms started over the past 24 hours.  Tmax at home was around 101.  He denies ear pain, ear drainage, sore throat, wheezing, difficulty breathing, chest pain, abdominal pain, nausea, vomiting, or diarrhea.  Patient reports that he has been taking ibuprofen, his last dose was around 130pm today.  Patient denies any obvious known sick contacts.  Patient reports that he had COVID in August.  Past Medical History:  Diagnosis Date   Allergy    SEASONAL   Anxiety    Cataract    BEGINNING,BILATERAL   Diabetes mellitus without complication (HCC)    GERD (gastroesophageal reflux disease)    Hyperlipidemia    Hypertension    SVT (supraventricular tachycardia)     Patient Active Problem List   Diagnosis Date Noted   SVT (supraventricular tachycardia) 06/10/2020   Palpitations 06/10/2020   Chest pressure 06/10/2020   Essential hypertension 06/10/2020   Hyperlipidemia 06/10/2020   Diabetes mellitus type 2, noninsulin dependent (HCC) 06/10/2020   Left shoulder pain 02/21/2013   Rotator cuff syndrome of left shoulder 02/21/2013    Past Surgical History:  Procedure Laterality Date   APPENDECTOMY     CARPAL TUNNEL RELEASE Left 04/01/2021   Procedure: LEFT CARPAL TUNNEL RELEASE;  Surgeon: Cindee Salt, MD;  Location: [facility];  Service: Orthopedics;  Laterality: Left;  MAC and beir block   COLONOSCOPY     EYE SURGERY Right    LASER FOR RETINA TEAR   POLYPECTOMY     SVT ABLATION N/A 08/02/2020   Procedure: SVT ABLATION;  Surgeon: Regan Lemming, MD;   [facility];  Service: Cardiovascular;  Laterality: N/A;   TRIGGER FINGER RELEASE Left 04/01/2021   Procedure: RELEASE TRIGGER FINGER/A-1 PULLEY LEFT MIDDLE FINGER;  Surgeon: Cindee Salt, MD;  Location: [facility];  Service: Orthopedics;  Laterality: Left;  MAC and beir block   VASECTOMY         Home Medications    Prior to Admission medications   Medication Sig Start Date End Date Taking? Authorizing Provider  acetaminophen (TYLENOL) 500 MG tablet Take 1,000 mg by mouth every 6 (six) hours as needed for mild pain or headache.   Yes [provider]  ALPRAZolam (XANAX) 0.25 MG tablet Take 0.25 mg by mouth in the morning and at bedtime.   Yes [provider]  aspirin EC 81 MG tablet Take 81 mg by mouth daily.   Yes [provider]  benzonatate (TESSALON) 100 MG capsule Take 1 capsule (100 mg total) by mouth every 8 (eight) hours. 05/24/23  Yes Juliah Scadden-Warren, Sadie Haber, NP  cetirizine (ZYRTEC) 10 MG tablet Take 1 tablet (10 mg total) by mouth daily. 05/24/23  Yes Masen Salvas-Warren, Sadie Haber, NP  ezetimibe (ZETIA) 10 MG tablet Take 10 mg by mouth at bedtime.    Yes [provider]  fluticasone (FLONASE) 50 MCG/ACT nasal spray Place 2 sprays into both nostrils daily. 05/24/23  Yes Merica Prell-Warren, Sadie Haber, NP  metFORMIN (  GLUCOPHAGE) 1000 MG tablet Take 1,000 mg by mouth 2 (two) times daily with a meal.    Yes [provider]  metoprolol succinate (TOPROL XL) 25 MG 24 hr tablet Take 1 tablet (25 mg total) by mouth in the morning and at bedtime. 06/16/22  Yes Camnitz, Will Daphine Deutscher, MD  Multiple Vitamins-Minerals (VITAMIN C EFFERVESCENT BLEND PO) Take 1 packet by mouth daily.   Yes [provider]  omeprazole (PRILOSEC) 40 MG capsule Take 40 mg by mouth daily.   Yes [provider]  Propylene Glycol (SYSTANE COMPLETE OP) Place 1 drop into both eyes in the morning and at bedtime.   Yes [provider]   RYBELSUS 7 MG TABS Take 1 tablet by mouth daily. 03/25/22  Yes [provider]  azithromycin (ZITHROMAX) 250 MG tablet Take 1 tablet (250 mg total) by mouth daily. Take first 2 tablets together, then 1 every day until finished. 10/12/22   Lamptey, Britta Mccreedy, MD  diltiazem (CARDIZEM) 30 MG tablet Take 1 tablet (30 mg total) by mouth every 6 (six) hours as needed (for elevated heart rates). 07/12/20   Camnitz, Andree Coss, MD  lidocaine (XYLOCAINE) 2 % solution Use as directed 10 mLs in the mouth or throat every 3 (three) hours as needed for mouth pain. 10/10/22   Particia Nearing, PA-C    Family History Family History  Problem Relation Age of Onset   Diabetes Mother    Heart attack Father    Colon cancer Neg Hx    Esophageal cancer Neg Hx    Liver cancer Neg Hx    Pancreatic cancer Neg Hx    Rectal cancer Neg Hx    Stomach cancer Neg Hx    Colon polyps Neg Hx    Crohn's disease Neg Hx     Social History Social History   Tobacco Use   Smoking status: Never    Passive exposure: Past ("long time ago")   Smokeless tobacco: Never  Vaping Use   Vaping status: Never Used  Substance Use Topics   Alcohol use: Yes    Comment: social   Drug use: No     Allergies   Macrodantin [nitrofurantoin macrocrystal], Clindamycin/lincomycin, and Septra [sulfamethoxazole-trimethoprim]   Review of Systems Review of Systems Per HPI  Physical Exam Triage Vital Signs ED Triage Vitals [05/24/23 1508]  Encounter Vitals Group     BP (!) 149/84     Systolic BP Percentile      Diastolic BP Percentile      Pulse Rate (!) 114     Resp 18     Temp (!) 101.5 F (38.6 C)     Temp Source Oral     SpO2 95 %     Weight      Height      Head Circumference      Peak Flow      Pain Score 0     Pain Loc      Pain Education      Exclude from Growth Chart    No data found.  Updated Vital Signs BP (!) 149/84 (BP Location: Right Arm)   Pulse (!) 114   Temp (!) 101.5 F (38.6 C)  (Oral)   Resp 18   SpO2 95%   Visual Acuity Right Eye Distance:   Left Eye Distance:   Bilateral Distance:    Right Eye Near:   Left Eye Near:    Bilateral Near:     Physical  Exam Vitals and nursing note reviewed.  Constitutional:      General: He is not in acute distress.    Appearance: Normal appearance.  HENT:     Head: Normocephalic.     Right Ear: Tympanic membrane, ear canal and external ear normal.     Left Ear: Tympanic membrane, ear canal and external ear normal.     Nose: Congestion present.     Right Turbinates: Enlarged and swollen.     Left Turbinates: Enlarged and swollen.     Right Sinus: No maxillary sinus tenderness or frontal sinus tenderness.     Left Sinus: No maxillary sinus tenderness or frontal sinus tenderness.     Mouth/Throat:     Lips: Pink.     Mouth: Mucous membranes are moist.     Pharynx: Uvula midline. Posterior oropharyngeal erythema and postnasal drip present. No pharyngeal swelling or oropharyngeal exudate.  Eyes:     Extraocular Movements: Extraocular movements intact.     Conjunctiva/sclera: Conjunctivae normal.     Pupils: Pupils are equal, round, and reactive to light.  Cardiovascular:     Rate and Rhythm: Regular rhythm. Tachycardia present.     Pulses: Normal pulses.     Heart sounds: Normal heart sounds.  Pulmonary:     Effort: Pulmonary effort is normal. No respiratory distress.     Breath sounds: Normal breath sounds. No stridor. No wheezing, rhonchi or rales.  Abdominal:     General: Bowel sounds are normal.     Palpations: Abdomen is soft.     Tenderness: There is no abdominal tenderness.  Musculoskeletal:     Cervical back: Normal range of motion.  Lymphadenopathy:     Cervical: No cervical adenopathy.  Skin:    General: Skin is warm and dry.  Neurological:     General: No focal deficit present.     Mental Status: He is alert and oriented to person, place, and time.  Psychiatric:        Mood and Affect: Mood  normal.        Behavior: Behavior normal.      UC Treatments / Results  Labs (all labs ordered are listed, but only abnormal results are displayed) Labs Reviewed  POCT INFLUENZA A/B    EKG   Radiology No results found.  Procedures Procedures (including critical care time)  Medications Ordered in UC Medications  acetaminophen (TYLENOL) tablet 650 mg (650 mg Oral Given 05/24/23 1539)    Initial Impression / Assessment and Plan / UC Course  I have reviewed the triage vital signs and the nursing notes.  Pertinent labs & imaging results that were available during my care of the patient were reviewed by me and considered in my medical decision making (see chart for details).  The patient is well-appearing, he is in no acute distress, patient is tachycardic and febrile, tachycardia most likely related to the fever.  Tylenol 650 mg p.o. administered.  Influenza test was negative.  COVID test is pending.  Patient is a candidate to receive Paxlovid (renal dosing) if his COVID test is positive.   Suspect symptoms are related to viral upper respiratory infection.  Benzonatate 100 mg prescribed for cough, and fluticasone 50 mcg nasal spray prescribed for nasal congestion and runny nose, along with cetirizine 10 mg.  Supportive care recommendations were provided and discussed with the patient to include increasing fluids, allowing for plenty of rest, over-the-counter analgesics, and normal saline nasal spray.  Discussed viral etiology with the  patient and when follow-up may be indicated.  Also discussed with patient that if his test is positive for COVID, he should remain home until his been fever free for 24 hours with no medication.  Patient is in agreement with this plan of care and verbalizes understanding.  All questions were answered.  Patient stable for discharge.    Final Clinical Impressions(s) / UC Diagnoses   Final diagnoses:  Viral upper respiratory tract infection with cough      Discharge Instructions      The influenza test was negative.  COVID test is pending.  You will be contacted if the pending test result is positive.  You also have access to your results via MyChart. Take medication as prescribed. Increase fluids and allow for plenty of rest. Continue over-the-counter Tylenol as needed for pain, fever, general discomfort. Warm salt water gargles throughout the day to help with sore throat. Normal saline nasal spray for nasal congestion and runny nose.  You may use the nasal spray you have at home to help with the nasal congestion and runny nose. For the cough, recommend using a humidifier in the bedroom at nighttime during sleep and sleeping elevated on pillows while cough symptoms persist. If your COVID test is positive and you continue to have a fever, you should remain home until you have been fever free for 24 hours with no medication. If you require medication for COVID, wear your mask while you are taking the medication.  If you continue to experience symptoms after completing the medication, continue to wear your mask for an additional 5 days. Follow-up in the emergency department immediately if you experience fevers, shortness of breath, difficulty breathing, or other concerns. Please notify your primary care physician of your recent positive COVID test. Follow-up as needed.      ED Prescriptions     Medication Sig Dispense Auth. Provider   benzonatate (TESSALON) 100 MG capsule Take 1 capsule (100 mg total) by mouth every 8 (eight) hours. 30 capsule Abri Vacca-Warren, Sadie Haber, NP   fluticasone (FLONASE) 50 MCG/ACT nasal spray Place 2 sprays into both nostrils daily. 16 g Nakota Ackert-Warren, Sadie Haber, NP   cetirizine (ZYRTEC) 10 MG tablet Take 1 tablet (10 mg total) by mouth daily. 30 tablet Lafern Brinkley-Warren, Sadie Haber, NP      PDMP not reviewed this encounter.   Abran Cantor, NP 05/24/23 1544

## 2023-05-24 NOTE — ED Triage Notes (Signed)
Fever, cough, body aches, chills, congestion that started yesterday. Taking ibuprofen last does at 1:30pm today.

## 2023-05-24 NOTE — Discharge Instructions (Signed)
The influenza test was negative.  COVID test is pending.  You will be contacted if the pending test result is positive.  You also have access to your results via MyChart. Take medication as prescribed. Increase fluids and allow for plenty of rest. Continue over-the-counter Tylenol as needed for pain, fever, general discomfort. Warm salt water gargles throughout the day to help with sore throat. Normal saline nasal spray for nasal congestion and runny nose.  You may use the nasal spray you have at home to help with the nasal congestion and runny nose. For the cough, recommend using a humidifier in the bedroom at nighttime during sleep and sleeping elevated on pillows while cough symptoms persist. If your COVID test is positive and you continue to have a fever, you should remain home until you have been fever free for 24 hours with no medication. If you require medication for COVID, wear your mask while you are taking the medication.  If you continue to experience symptoms after completing the medication, continue to wear your mask for an additional 5 days. Follow-up in the emergency department immediately if you experience fevers, shortness of breath, difficulty breathing, or other concerns. Please notify your primary care physician of your recent positive COVID test. Follow-up as needed.

## 2023-05-25 LAB — SARS CORONAVIRUS 2 (TAT 6-24 HRS): SARS Coronavirus 2: NEGATIVE

## 2023-06-10 DIAGNOSIS — E118 Type 2 diabetes mellitus with unspecified complications: Secondary | ICD-10-CM | POA: Diagnosis not present

## 2023-06-17 DIAGNOSIS — E1169 Type 2 diabetes mellitus with other specified complication: Secondary | ICD-10-CM | POA: Diagnosis not present

## 2023-06-17 DIAGNOSIS — F411 Generalized anxiety disorder: Secondary | ICD-10-CM | POA: Diagnosis not present

## 2023-07-09 ENCOUNTER — Encounter: Payer: Self-pay | Admitting: Cardiology

## 2023-07-12 ENCOUNTER — Other Ambulatory Visit: Payer: Self-pay

## 2023-07-12 MED ORDER — METOPROLOL SUCCINATE ER 25 MG PO TB24: 25.0000 mg | ORAL_TABLET | Freq: Two times a day (BID) | ORAL | 3 refills | Status: DC

## 2023-08-17 DIAGNOSIS — Z23 Encounter for immunization: Secondary | ICD-10-CM | POA: Diagnosis not present

## 2023-09-10 DIAGNOSIS — E119 Type 2 diabetes mellitus without complications: Secondary | ICD-10-CM | POA: Diagnosis not present

## 2023-09-22 ENCOUNTER — Ambulatory Visit: Payer: Medicare PPO | Admitting: Nurse Practitioner

## 2023-09-23 DIAGNOSIS — E118 Type 2 diabetes mellitus with unspecified complications: Secondary | ICD-10-CM | POA: Diagnosis not present

## 2023-09-30 DIAGNOSIS — E1169 Type 2 diabetes mellitus with other specified complication: Secondary | ICD-10-CM | POA: Diagnosis not present

## 2023-09-30 DIAGNOSIS — E041 Nontoxic single thyroid nodule: Secondary | ICD-10-CM | POA: Diagnosis not present

## 2023-10-07 ENCOUNTER — Other Ambulatory Visit: Payer: Self-pay | Admitting: Cardiology

## 2023-10-17 NOTE — Progress Notes (Unsigned)
  Electrophysiology Office Note:   Date:  10/18/2023  ID:  Edward Sherk., DOB 1951-02-26, MRN [id number]  Primary Cardiologist: Nona Dell, MD Primary Heart Failure: None Electrophysiologist: Yosmar Ryker Jorja Loa, MD      History of Present Illness:   Edward Quezada. is a 73 y.o. male with h/o hypertension, diabetes, SVT seen today for routine electrophysiology followup.   Since last being seen in our clinic the patient reports doing well.  He has noted 1 further episode of tachycardia, but this has been rare.  This was also short-lived.  He continues to be on a do all his daily activities.  He has not needed his as needed diltiazem.  he denies chest pain, palpitations, dyspnea, PND, orthopnea, nausea, vomiting, dizziness, syncope, edema, weight gain, or early satiety.   Review of systems complete and found to be negative unless listed in HPI.   EP Information / Studies Reviewed:    EKG is ordered today. Personal review as below.  EKG Interpretation Date/Time:  Monday October 18 2023 10:59:39 EST Ventricular Rate:  78 PR Interval:  196 QRS Duration:  94 QT Interval:  356 QTC Calculation: 405 R Axis:   72  Text Interpretation: Normal sinus rhythm Normal ECG When compared with ECG of 16-Nov-2020 21:23, Sinus rhythm Confirmed by Venie Montesinos ([id number]) on 10/18/2023 11:14:08 AM     Risk Assessment/Calculations:              Physical Exam:   VS:  BP 130/86 (BP Location: Left Arm, Patient Position: Sitting, Cuff Size: Large)   Pulse 78   Ht 5\' 11"  (1.803 m)   Wt 200 lb (90.7 kg)   SpO2 99%   BMI 27.89 kg/m    Wt Readings from Last 3 Encounters:  10/18/23 200 lb (90.7 kg)  06/16/22 203 lb (92.1 kg)  12/15/21 200 lb (90.7 kg)     GEN: Well nourished, well developed in no acute distress NECK: No JVD; No carotid bruits CARDIAC: Regular rate and rhythm, no murmurs, rubs, gallops RESPIRATORY:  Clear to auscultation without rales, wheezing or rhonchi  ABDOMEN: Soft,  non-tender, non-distended EXTREMITIES:  No edema; No deformity   ASSESSMENT AND PLAN:    1.  SVT: EP study in 2021 with multiple episodes of SVT during pacing, but would terminate post pacing.  Currently on Toprol-XL.  Minimal episodes.  He has not required as needed diltiazem.  Editha Bridgeforth continue with current management.  2.  Hypertension: Currently well-controlled  3.  Hyperlipidemia: Continue Zetia per PCP  Follow up with EP APP in 12 months  Signed, Deserae Jennings Jorja Loa, MD

## 2023-10-18 ENCOUNTER — Ambulatory Visit: Payer: Medicare PPO | Attending: Cardiology | Admitting: Cardiology

## 2023-10-18 ENCOUNTER — Encounter: Payer: Self-pay | Admitting: Cardiology

## 2023-10-18 VITALS — BP 130/86 | HR 78 | Ht 71.0 in | Wt 200.0 lb

## 2023-10-18 DIAGNOSIS — I471 Supraventricular tachycardia, unspecified: Secondary | ICD-10-CM | POA: Diagnosis not present

## 2023-10-18 DIAGNOSIS — R002 Palpitations: Secondary | ICD-10-CM | POA: Diagnosis not present

## 2023-10-18 DIAGNOSIS — I10 Essential (primary) hypertension: Secondary | ICD-10-CM

## 2023-10-18 MED ORDER — DILTIAZEM HCL 30 MG PO TABS: 30.0000 mg | ORAL_TABLET | Freq: Four times a day (QID) | ORAL | 1 refills | Status: AC | PRN

## 2023-10-18 MED ORDER — METOPROLOL SUCCINATE ER 25 MG PO TB24: 12.5000 mg | ORAL_TABLET | Freq: Every day | ORAL | 3 refills | Status: DC

## 2023-10-18 NOTE — Patient Instructions (Signed)
 Medication Instructions:  Your physician recommends that you continue on your current medications as directed. Please refer to the Current Medication list given to you today.  *If you need a refill on your cardiac medications before your next appointment, please call your pharmacy*   Lab Work: None ordered   Testing/Procedures: None ordered   Follow-Up: At [facility], you and your health needs are our priority.  As part of our continuing mission to provide you with exceptional heart care, we have created designated Provider Care Teams.  These Care Teams include your primary Cardiologist (physician) and Advanced Practice Providers (APPs -  Physician Assistants and Nurse Practitioners) who all work together to provide you with the care you need, when you need it.  Your next appointment:   1 year(s)  The format for your next appointment:   In Person  Provider:   You will see one of the following Advanced Practice Providers on your designated Care Team:   Francis Dowse, Charlott Holler [address] [place], [place] Sherie Don, NP Canary Brim, NP   Thank you for choosing [facility]!!   Dory Horn, RN [phone]

## 2023-10-25 ENCOUNTER — Telehealth: Payer: Self-pay | Admitting: Cardiology

## 2023-10-25 MED ORDER — METOPROLOL SUCCINATE ER 25 MG PO TB24: 12.5000 mg | ORAL_TABLET | Freq: Two times a day (BID) | ORAL | 3 refills | Status: DC

## 2023-10-25 NOTE — Telephone Encounter (Signed)
 Pt c/o medication issue:  1. Name of Medication:   metoprolol succinate (TOPROL-XL) 25 MG 24 hr tablet    2. How are you currently taking this medication (dosage and times per day)? As written   3. Are you having a reaction (difficulty breathing--STAT)? No   4. What is your medication issue? Pt called in stating he usually takes 25 mg daily but the rx changed to half daily. He asked for clarification on why it changed. Please advise.

## 2023-10-25 NOTE — Telephone Encounter (Signed)
 Pt reports he is taking Metoprolol Succinate 25 mg BID, Rx sent in was for only once a day.  He is unaware of any medication changes from his appt last week.  Informed that I do not see that we made/advised on any changes.  Will confirm w/ MD and send in updated/correct Rx.  Pt aware I will only call him back if MD has other advisement.  Patient agreeable to plan.  Dr. Elberta Fortis agreeable.  Corrected Rx sent in

## 2023-10-26 ENCOUNTER — Other Ambulatory Visit (HOSPITAL_COMMUNITY): Payer: Self-pay | Admitting: Internal Medicine

## 2023-10-26 DIAGNOSIS — E041 Nontoxic single thyroid nodule: Secondary | ICD-10-CM

## 2023-11-01 ENCOUNTER — Ambulatory Visit (HOSPITAL_COMMUNITY)
Admission: RE | Admit: 2023-11-01 | Discharge: 2023-11-01 | Disposition: A | Discharge status: Home / Self Care | Payer: Medicare PPO | Source: Ambulatory Visit | Attending: Internal Medicine | Admitting: Internal Medicine

## 2023-11-01 DIAGNOSIS — E041 Nontoxic single thyroid nodule: Secondary | ICD-10-CM | POA: Insufficient documentation

## 2023-11-08 ENCOUNTER — Ambulatory Visit
Admission: RE | Admit: 2023-11-08 | Discharge: 2023-11-08 | Disposition: A | Discharge status: Home / Self Care | Source: Ambulatory Visit | Attending: Nurse Practitioner | Admitting: Nurse Practitioner

## 2023-11-08 VITALS — BP 145/84 | HR 93 | Temp 98.7°F | Resp 16

## 2023-11-08 DIAGNOSIS — H6121 Impacted cerumen, right ear: Secondary | ICD-10-CM

## 2023-11-08 NOTE — ED Triage Notes (Signed)
 Right ear fullness, decreased hearing x 1 day.

## 2023-11-08 NOTE — Discharge Instructions (Signed)
 We removed the earwax from the right ear today.  Continue to not use Q-tips.  Contact information for ENT has been provided if you like to call to make an appointment.

## 2023-11-08 NOTE — ED Provider Notes (Signed)
 [facility]    CSN: [id number] Arrival date & time: 11/08/23  1418      History   Chief Complaint Chief Complaint  Patient presents with   Ear Fullness    Entered by patient    HPI Edward Armstrong. is a 73 y.o. male.   Patient presents today for 1 day history of right ear fullness.  He endorses decreased hearing from the ear.  Reports history of ceruminosis of the right ear.  Tried to clean his ear with a curette yesterday without full success.  No ear pain, recent fever, cough, congestion, or significant sore throat.  Does not use Q-tips.   Patient is wondering if he needs to follow-up with ENT in the future for preventative earwax removals.    Past Medical History:  Diagnosis Date   Allergy    SEASONAL   Anxiety    Cataract    BEGINNING,BILATERAL   Diabetes mellitus without complication (HCC)    GERD (gastroesophageal reflux disease)    Hyperlipidemia    Hypertension    SVT (supraventricular tachycardia) (HCC)     Patient Active Problem List   Diagnosis Date Noted   SVT (supraventricular tachycardia) (HCC) 06/10/2020   Palpitations 06/10/2020   Chest pressure 06/10/2020   Essential hypertension 06/10/2020   Hyperlipidemia 06/10/2020   Diabetes mellitus type 2, noninsulin dependent (HCC) 06/10/2020   Left shoulder pain 02/21/2013   Rotator cuff syndrome of left shoulder 02/21/2013    Past Surgical History:  Procedure Laterality Date   APPENDECTOMY     CARPAL TUNNEL RELEASE Left 04/01/2021   Procedure: LEFT CARPAL TUNNEL RELEASE;  Surgeon: Cindee Salt, MD;  Location: [facility];  Service: Orthopedics;  Laterality: Left;  MAC and beir block   COLONOSCOPY     EYE SURGERY Right    LASER FOR RETINA TEAR   POLYPECTOMY     SVT ABLATION N/A 08/02/2020   Procedure: SVT ABLATION;  Surgeon: Regan Lemming, MD;  [facility]: [facility];  Service: Cardiovascular;  Laterality: N/A;   TRIGGER FINGER RELEASE Left 04/01/2021    Procedure: RELEASE TRIGGER FINGER/A-1 PULLEY LEFT MIDDLE FINGER;  Surgeon: Cindee Salt, MD;  Location:  SUR[facility]ERY CENTER;  Service: Orthopedics;  Laterality: Left;  MAC and beir block   VASECTOMY         Home Medications    Prior to Admission medications   Medication Sig Start Date End Date Taking? Authorizing Provider  ALPRAZolam Prudy Feeler) 0.25 MG tablet Take 0.25 mg by mouth in the morning and at bedtime.   Yes [provider]  aspirin EC 81 MG tablet Take 81 mg by mouth daily.   Yes [provider]  ezetimibe (ZETIA) 10 MG tablet Take 10 mg by mouth at bedtime.    Yes [provider]  metFORMIN (GLUCOPHAGE) 1000 MG tablet Take 1,000 mg by mouth 2 (two) times daily with a meal.    Yes [provider]  metoprolol succinate (TOPROL-XL) 25 MG 24 hr tablet Take 0.5 tablets (12.5 mg total) by mouth in the morning and at bedtime. 10/25/23  Yes Camnitz, Will Daphine Deutscher, MD  Multiple Vitamins-Minerals (VITAMIN C EFFERVESCENT BLEND PO) Take 1 packet by mouth daily.   Yes [provider]  omeprazole (PRILOSEC) 40 MG capsule Take 40 mg by mouth daily.   Yes [provider]  RYBELSUS 7 MG TABS Take 1 tablet by mouth daily. 03/25/22  Yes [provider]  acetaminophen (TYLENOL) 500  MG tablet Take 1,000 mg by mouth every 6 (six) hours as needed for mild pain or headache.    [provider]  cetirizine (ZYRTEC) 10 MG tablet Take 1 tablet (10 mg total) by mouth daily. Patient not taking: Reported on 10/18/2023 05/24/23   [name], Sadie Haber, NP  diclofenac (VOLTAREN) 75 MG EC tablet Take 75 mg by mouth 2 (two) times daily. 10/17/23   [provider]  diltiazem (CARDIZEM) 30 MG tablet Take 1 tablet (30 mg total) by mouth every 6 (six) hours as needed (for elevated heart rates). 10/18/23   Camnitz, Will Daphine Deutscher, MD  fluticasone (FLONASE) 50 MCG/ACT nasal spray Place 2 sprays into both nostrils daily. Patient not taking:  Reported on 10/18/2023 05/24/23   [name], Sadie Haber, NP  lidocaine (XYLOCAINE) 2 % solution Use as directed 10 mLs in the mouth or throat every 3 (three) hours as needed for mouth pain. Patient not taking: Reported on 10/18/2023 10/10/22   Particia Nearing, PA-C  Propylene Glycol (SYSTANE COMPLETE OP) Place 1 drop into both eyes in the morning and at bedtime. Patient not taking: Reported on 11/08/2023    [provider]    Family History Family History  Problem Relation Age of Onset   Diabetes Mother    Heart attack Father    Colon cancer Neg Hx    Esophageal cancer Neg Hx    Liver cancer Neg Hx    Pancreatic cancer Neg Hx    Rectal cancer Neg Hx    Stomach cancer Neg Hx    Colon polyps Neg Hx    Crohn's disease Neg Hx     Social History Social History   Tobacco Use   Smoking status: Never    Passive exposure: Past ("long time ago")   Smokeless tobacco: Never  Vaping Use   Vaping status: Never Used  Substance Use Topics   Alcohol use: Yes    Comment: social   Drug use: No     Allergies   Macrodantin [nitrofurantoin macrocrystal], Clindamycin/lincomycin, and Septra [sulfamethoxazole-trimethoprim]   Review of Systems Review of Systems Per HPI  Physical Exam Triage Vital Signs ED Triage Vitals  Encounter Vitals Group     BP 11/08/23 1439 (!) 145/84     Systolic BP Percentile --      Diastolic BP Percentile --      Pulse Rate 11/08/23 1439 93     Resp 11/08/23 1439 16     Temp 11/08/23 1439 98.7 F (37.1 C)     Temp Source 11/08/23 1439 Oral     SpO2 11/08/23 1439 96 %     Weight --      Height --      Head Circumference --      Peak Flow --      Pain Score 11/08/23 1441 0     Pain Loc --      Pain Education --      Exclude from Growth Chart --    No data found.  Updated Vital Signs BP (!) 145/84 (BP Location: Right Arm)   Pulse 93   Temp 98.7 F (37.1 C) (Oral)   Resp 16   SpO2 96%   Visual Acuity Right Eye Distance:   Left  Eye Distance:   Bilateral Distance:    Right Eye Near:   Left Eye Near:    Bilateral Near:     Physical Exam Vitals and nursing note reviewed.  Constitutional:  General: He is not in acute distress.    Appearance: Normal appearance. He is not toxic-appearing.  HENT:     Head: Normocephalic and atraumatic.     Right Ear: There is impacted cerumen.     Left Ear: Tympanic membrane, ear canal and external ear normal.     Nose: Nose normal. No congestion or rhinorrhea.     Mouth/Throat:     Mouth: Mucous membranes are moist.     Pharynx: Oropharynx is clear.  Pulmonary:     Effort: Pulmonary effort is normal. No respiratory distress.  Musculoskeletal:     Cervical back: Normal range of motion.  Lymphadenopathy:     Cervical: No cervical adenopathy.  Skin:    General: Skin is warm and dry.     Coloration: Skin is not jaundiced or pale.     Findings: No erythema.  Neurological:     Mental Status: He is alert and oriented to person, place, and time.  Psychiatric:        Behavior: Behavior is cooperative.      UC Treatments / Results  Labs (all labs ordered are listed, but only abnormal results are displayed) Labs Reviewed - No data to display  EKG   Radiology No results found.  Procedures Ear Cerumen Removal  Date/Time: 11/08/2023 4:13 PM  Performed by: Valentino Nose, NP Authorized by: Valentino Nose, NP   Consent:    Consent obtained:  Verbal   Consent given by:  Patient   Risks, benefits, and alternatives were discussed: yes     Risks discussed:  TM perforation, incomplete removal, dizziness, bleeding, infection and pain   Alternatives discussed:  Alternative treatment Universal protocol:    Procedure explained and questions answered to patient or proxy's satisfaction: yes     Patient identity confirmed:  Verbally with patient Procedure details:    Location:  R ear   Procedure type: irrigation     Procedure outcomes: unable to remove cerumen    Post-procedure details:    Inspection:  No bleeding, ear canal clear and TM intact   Hearing quality:  Improved   Procedure completion:  Tolerated well, no immediate complications Comments:     Irrigation was used initially to remove cerumen without full success; upon reexamination cerumen appeared to be remaining in external auditory canal.  I used alligator forceps to remove remaining cerumen.  Patient tolerated well.  Tympanic membrane intact.  Patient reports subjective improved hearing.  (including critical care time)  Medications Ordered in UC Medications - No data to display  Initial Impression / Assessment and Plan / UC Course  I have reviewed the triage vital signs and the nursing notes.  Pertinent labs & imaging results that were available during my care of the patient were reviewed by me and considered in my medical decision making (see chart for details).   Patient is well-appearing, normotensive, afebrile, not tachycardic, not tachypneic, oxygenating well on room air.    1. Impacted cerumen of right ear Ear lavage and alligator forceps used for complete removal; see procedure note above Continue to not use Q-tips ENT contact information given if patient desires to follow-up with ENT in the future  The patient was given the opportunity to ask questions.  All questions answered to their satisfaction.  The patient is in agreement to this plan.   Final Clinical Impressions(s) / UC Diagnoses   Final diagnoses:  Impacted cerumen of right ear     Discharge Instructions  We removed the earwax from the right ear today.  Continue to not use Q-tips.  Contact information for ENT has been provided if you like to call to make an appointment.     ED Prescriptions   None    PDMP not reviewed this encounter.   Valentino Nose, NP 11/08/23 [id number]

## 2023-11-15 ENCOUNTER — Encounter (INDEPENDENT_AMBULATORY_CARE_PROVIDER_SITE_OTHER): Payer: 59 | Admitting: Ophthalmology

## 2023-11-15 DIAGNOSIS — H33301 Unspecified retinal break, right eye: Secondary | ICD-10-CM | POA: Diagnosis not present

## 2023-11-15 DIAGNOSIS — H43813 Vitreous degeneration, bilateral: Secondary | ICD-10-CM | POA: Diagnosis not present

## 2023-11-15 DIAGNOSIS — I10 Essential (primary) hypertension: Secondary | ICD-10-CM | POA: Diagnosis not present

## 2023-11-15 DIAGNOSIS — H35033 Hypertensive retinopathy, bilateral: Secondary | ICD-10-CM | POA: Diagnosis not present

## 2023-11-15 DIAGNOSIS — D3131 Benign neoplasm of right choroid: Secondary | ICD-10-CM

## 2023-11-15 DIAGNOSIS — D3132 Benign neoplasm of left choroid: Secondary | ICD-10-CM

## 2023-11-18 ENCOUNTER — Other Ambulatory Visit: Payer: Self-pay | Admitting: Cardiology

## 2023-11-30 DIAGNOSIS — L821 Other seborrheic keratosis: Secondary | ICD-10-CM | POA: Diagnosis not present

## 2024-02-04 DIAGNOSIS — Z125 Encounter for screening for malignant neoplasm of prostate: Secondary | ICD-10-CM | POA: Diagnosis not present

## 2024-02-04 DIAGNOSIS — K219 Gastro-esophageal reflux disease without esophagitis: Secondary | ICD-10-CM | POA: Diagnosis not present

## 2024-02-04 DIAGNOSIS — I10 Essential (primary) hypertension: Secondary | ICD-10-CM | POA: Diagnosis not present

## 2024-02-04 DIAGNOSIS — E785 Hyperlipidemia, unspecified: Secondary | ICD-10-CM | POA: Diagnosis not present

## 2024-02-04 DIAGNOSIS — E118 Type 2 diabetes mellitus with unspecified complications: Secondary | ICD-10-CM | POA: Diagnosis not present

## 2024-02-04 DIAGNOSIS — Z79899 Other long term (current) drug therapy: Secondary | ICD-10-CM | POA: Diagnosis not present

## 2024-02-14 DIAGNOSIS — I471 Supraventricular tachycardia, unspecified: Secondary | ICD-10-CM | POA: Diagnosis not present

## 2024-02-14 DIAGNOSIS — Z0001 Encounter for general adult medical examination with abnormal findings: Secondary | ICD-10-CM | POA: Diagnosis not present

## 2024-02-14 DIAGNOSIS — F411 Generalized anxiety disorder: Secondary | ICD-10-CM | POA: Diagnosis not present

## 2024-02-14 DIAGNOSIS — I10 Essential (primary) hypertension: Secondary | ICD-10-CM | POA: Diagnosis not present

## 2024-02-14 DIAGNOSIS — E785 Hyperlipidemia, unspecified: Secondary | ICD-10-CM | POA: Diagnosis not present

## 2024-02-14 DIAGNOSIS — E1169 Type 2 diabetes mellitus with other specified complication: Secondary | ICD-10-CM | POA: Diagnosis not present

## 2024-06-08 DIAGNOSIS — E118 Type 2 diabetes mellitus with unspecified complications: Secondary | ICD-10-CM | POA: Diagnosis not present

## 2024-06-27 DIAGNOSIS — G720 Drug-induced myopathy: Secondary | ICD-10-CM | POA: Diagnosis not present

## 2024-06-27 DIAGNOSIS — I10 Essential (primary) hypertension: Secondary | ICD-10-CM | POA: Diagnosis not present

## 2024-06-27 DIAGNOSIS — E1169 Type 2 diabetes mellitus with other specified complication: Secondary | ICD-10-CM | POA: Diagnosis not present

## 2024-06-27 DIAGNOSIS — Z23 Encounter for immunization: Secondary | ICD-10-CM | POA: Diagnosis not present

## 2024-06-27 DIAGNOSIS — F411 Generalized anxiety disorder: Secondary | ICD-10-CM | POA: Diagnosis not present

## 2024-07-11 ENCOUNTER — Other Ambulatory Visit: Payer: Self-pay

## 2024-07-11 ENCOUNTER — Ambulatory Visit: Admission: EM | Admit: 2024-07-11 | Discharge: 2024-07-11 | Disposition: A | Discharge status: Home / Self Care

## 2024-07-11 ENCOUNTER — Encounter: Payer: Self-pay | Admitting: Emergency Medicine

## 2024-07-11 ENCOUNTER — Ambulatory Visit: Payer: Self-pay

## 2024-07-11 DIAGNOSIS — H6122 Impacted cerumen, left ear: Secondary | ICD-10-CM

## 2024-07-11 NOTE — ED Provider Notes (Signed)
 [facility]    CSN: [id number] Arrival date & time: 07/11/24  1113      History   Chief Complaint Chief Complaint  Patient presents with   Ear Fullness    HPI Edward Armstrong. is a 73 y.o. male.   Patient presents today with 1 day history of left ear fullness.  Reports it began this morning when he woke up.  Reports history of ceruminosis.  No fever, cough, congestion, sore throat, or ear pain recently.  No ear drainage.  Has tried Debrox drops without relief.    Past Medical History:  Diagnosis Date   Allergy    SEASONAL   Anxiety    Cataract    BEGINNING,BILATERAL   Diabetes mellitus without complication (HCC)    GERD (gastroesophageal reflux disease)    Hyperlipidemia    Hypertension    SVT (supraventricular tachycardia)     Patient Active Problem List   Diagnosis Date Noted   SVT (supraventricular tachycardia) 06/10/2020   Palpitations 06/10/2020   Chest pressure 06/10/2020   Essential hypertension 06/10/2020   Hyperlipidemia 06/10/2020   Diabetes mellitus type 2, noninsulin dependent (HCC) 06/10/2020   Left shoulder pain 02/21/2013   Rotator cuff syndrome of left shoulder 02/21/2013    Past Surgical History:  Procedure Laterality Date   APPENDECTOMY     CARPAL TUNNEL RELEASE Left 04/01/2021   Procedure: LEFT CARPAL TUNNEL RELEASE;  Surgeon: Murrell Kuba, MD;  Location: [facility];  Service: Orthopedics;  Laterality: Left;  MAC and beir block   COLONOSCOPY     EYE SURGERY Right    LASER FOR RETINA TEAR   POLYPECTOMY     SVT ABLATION N/A 08/02/2020   Procedure: SVT ABLATION;  Surgeon: Inocencio Soyla Lunger, MD;  Location: [facility];  Service: Cardiovascular;  Laterality: N/A;   TRIGGER FINGER RELEASE Left 04/01/2021   Procedure: RELEASE TRIGGER FINGER/A-1 PULLEY LEFT MIDDLE FINGER;  Surgeon: Murrell Kuba, MD;  Location: [facility];  Service: Orthopedics;  Laterality: Left;  MAC and beir block    VASECTOMY         Home Medications    Prior to Admission medications   Medication Sig Start Date End Date Taking? Authorizing Provider  hydrocortisone (ANUSOL-HC) 2.5 % rectal cream Apply topically. 01/21/24  Yes [provider]  pioglitazone (ACTOS) 15 MG tablet Take 15 mg by mouth. 06/13/21  Yes [provider]  acetaminophen  (TYLENOL ) 500 MG tablet Take 1,000 mg by mouth every 6 (six) hours as needed for mild pain or headache.    [provider]  ALPRAZolam  (XANAX ) 0.25 MG tablet Take 0.25 mg by mouth in the morning and at bedtime.    [provider]  aspirin  EC 81 MG tablet Take 81 mg by mouth daily.    [provider]  cetirizine  (ZYRTEC ) 10 MG tablet Take 1 tablet (10 mg total) by mouth daily. Patient not taking: Reported on 10/18/2023 05/24/23   [name], [name], NP  diclofenac (VOLTAREN) 75 MG EC tablet Take 75 mg by mouth 2 (two) times daily. 10/17/23   [provider]  diltiazem  (CARDIZEM ) 30 MG tablet Take 1 tablet (30 mg total) by mouth every 6 (six) hours as needed (for elevated heart rates). 10/18/23   Camnitz, Soyla Lunger, MD  ezetimibe  (ZETIA ) 10 MG tablet Take 10 mg by mouth at bedtime.     [provider]  fluticasone  (FLONASE ) 50 MCG/ACT nasal spray Place 2 sprays  into both nostrils daily. Patient not taking: Reported on 10/18/2023 05/24/23   [name], [name], NP  lidocaine  (XYLOCAINE ) 2 % solution Use as directed 10 mLs in the mouth or throat every 3 (three) hours as needed for mouth pain. Patient not taking: Reported on 10/18/2023 10/10/22   [name], PA-C  metFORMIN (GLUCOPHAGE) 1000 MG tablet Take 1,000 mg by mouth 2 (two) times daily with a meal.     [provider]  metoprolol  succinate (TOPROL -XL) 25 MG 24 hr tablet Take 1 tablet by mouth in the morning and  1 tablet by mouth at bedtime., 11/19/23   Camnitz, Soyla Lunger, MD  Multiple Vitamins-Minerals (VITAMIN C EFFERVESCENT  BLEND PO) Take 1 packet by mouth daily.    [provider]  omeprazole (PRILOSEC) 40 MG capsule Take 40 mg by mouth daily.    [provider]  Propylene Glycol (SYSTANE COMPLETE OP) Place 1 drop into both eyes in the morning and at bedtime. Patient not taking: Reported on 11/08/2023    [provider]  RYBELSUS 7 MG TABS Take 1 tablet by mouth daily. 03/25/22   [provider]    Family History Family History  Problem Relation Age of Onset   Diabetes Mother    Heart attack Father    Colon cancer Neg Hx    Esophageal cancer Neg Hx    Liver cancer Neg Hx    Pancreatic cancer Neg Hx    Rectal cancer Neg Hx    Stomach cancer Neg Hx    Colon polyps Neg Hx    Crohn's disease Neg Hx     Social History Social History   Tobacco Use   Smoking status: Never    Passive exposure: Past (long time ago)   Smokeless tobacco: Never  Vaping Use   Vaping status: Never Used  Substance Use Topics   Alcohol use: Yes    Comment: social   Drug use: No     Allergies   Macrodantin [nitrofurantoin macrocrystal], Clindamycin/lincomycin, and Septra [sulfamethoxazole-trimethoprim]   Review of Systems Review of Systems Per HPI  Physical Exam Triage Vital Signs ED Triage Vitals  Encounter Vitals Group     BP 07/11/24 1123 (!) 136/92     Girls Systolic BP Percentile --      Girls Diastolic BP Percentile --      Boys Systolic BP Percentile --      Boys Diastolic BP Percentile --      Pulse Rate 07/11/24 1123 76     Resp 07/11/24 1123 20     Temp 07/11/24 1123 97.8 F (36.6 C)     Temp Source 07/11/24 1123 Oral     SpO2 07/11/24 1123 98 %     Weight --      Height --      Head Circumference --      Peak Flow --      Pain Score 07/11/24 1124 0     Pain Loc --      Pain Education --      Exclude from Growth Chart --    No data found.  Updated Vital Signs BP (!) 136/92 (BP Location: Right Arm)   Pulse 76   Temp 97.8 F (36.6 C) (Oral)   Resp  20   SpO2 98%   Visual Acuity Right Eye Distance:   Left Eye Distance:   Bilateral Distance:    Right Eye Near:   Left Eye Near:  Bilateral Near:     Physical Exam Vitals reviewed.  Constitutional:      General: He is not in acute distress.    Appearance: Normal appearance. He is not toxic-appearing.  HENT:     Head: Normocephalic and atraumatic.     Right Ear: Tympanic membrane, ear canal and external ear normal.     Left Ear: There is impacted cerumen.     Nose: Nose normal. No congestion or rhinorrhea.     Mouth/Throat:     Mouth: Mucous membranes are moist.     Pharynx: Oropharynx is clear.  Cardiovascular:     Rate and Rhythm: Normal rate.  Pulmonary:     Effort: Pulmonary effort is normal. No respiratory distress.  Musculoskeletal:     Cervical back: Normal range of motion.  Lymphadenopathy:     Cervical: No cervical adenopathy.  Skin:    General: Skin is warm and dry.     Capillary Refill: Capillary refill takes less than 2 seconds.     Coloration: Skin is not jaundiced or pale.     Findings: No erythema.  Neurological:     Mental Status: He is alert and oriented to person, place, and time.  Psychiatric:        Behavior: Behavior is cooperative.      UC Treatments / Results  Labs (all labs ordered are listed, but only abnormal results are displayed) Labs Reviewed - No data to display  EKG   Radiology No results found.  Procedures Ear Cerumen Removal  Date/Time: 07/11/2024 1:28 PM  Performed by: Chandra Harlene LABOR, NP Authorized by: Chandra Harlene LABOR, NP   Consent:    Consent obtained:  Verbal   Risks discussed:  Bleeding, infection, pain, TM perforation, incomplete removal and dizziness   Alternatives discussed:  Alternative treatment Universal protocol:    Procedure explained and questions answered to patient or proxy's satisfaction: yes     Patient identity confirmed:  Verbally with patient Procedure details:    Location:  L ear    Procedure type: irrigation     Procedure outcomes: cerumen removed   Post-procedure details:    Inspection:  TM intact and no bleeding   Hearing quality:  Improved   Procedure completion:  Tolerated well, no immediate complications Comments:     Unable to remove cerumen with irrigation; curette and alligator forceps used to remove cerumen.  Patient tolerated well and TM intact post procedure without bleeding or perforation.  (including critical care time)  Medications Ordered in UC Medications - No data to display  Initial Impression / Assessment and Plan / UC Course  I have reviewed the triage vital signs and the nursing notes.  Pertinent labs & imaging results that were available during my care of the patient were reviewed by me and considered in my medical decision making (see chart for details).   Patient is well-appearing, normotensive, afebrile, not tachycardic, not tachypneic, oxygenating well on room air.   1. Left ear impacted cerumen Ear lavage as above unable to fully remove cerumen, so alligator forceps utilized and patient tolerated well Continue Debrox prn    The patient was given the opportunity to ask questions.  All questions answered to their satisfaction.  The patient is in agreement to this plan.   Final Clinical Impressions(s) / UC Diagnoses   Final diagnoses:  Left ear impacted cerumen     Discharge Instructions      We removed the ear wax from your left ear  canal today.  Continue to avoid use of Qtips and ear plugs inside your ear.  You can continue Debrox drops as needed.      ED Prescriptions   None    PDMP not reviewed this encounter.   Chandra Harlene LABOR, NP 07/11/24 1330

## 2024-07-11 NOTE — ED Triage Notes (Signed)
 Pt reports left ear fullness since waking up this am. Has tried debrox drops and reports decreased hearing in bilateral ears. Denies any known injury or pain.

## 2024-07-11 NOTE — Discharge Instructions (Addendum)
 We removed the ear wax from your left ear canal today.  Continue to avoid use of Qtips and ear plugs inside your ear.  You can continue Debrox drops as needed.

## 2024-10-05 ENCOUNTER — Ambulatory Visit
Admission: RE | Admit: 2024-10-05 | Discharge: 2024-10-05 | Disposition: A | Discharge status: Home / Self Care | Source: Ambulatory Visit

## 2024-10-05 VITALS — BP 154/76 | HR 74 | Temp 97.7°F | Resp 18

## 2024-10-05 DIAGNOSIS — H60391 Other infective otitis externa, right ear: Secondary | ICD-10-CM | POA: Diagnosis not present

## 2024-10-05 DIAGNOSIS — H938X1 Other specified disorders of right ear: Secondary | ICD-10-CM | POA: Diagnosis not present

## 2024-10-05 MED ORDER — NEOMYCIN-POLYMYXIN-HC 3.5-10000-1 OT SUSP: 4.0000 [drp] | Freq: Three times a day (TID) | OTIC | 0 refills | Status: AC

## 2024-10-05 NOTE — Discharge Instructions (Signed)
 Apply Cortisporin eardrops 4 drops 3 times daily for 5 days for ear canal infection coverage. You can use any over-the-counter saline nasal spray to help with nasal congestion and attempt to help with ear fullness as well. Follow-up with primary care provider or return here as needed.

## 2024-10-05 NOTE — ED Triage Notes (Signed)
 Right ear feels stopped up and full.  States hearing is decreased in that ear

## 2024-10-05 NOTE — ED Provider Notes (Signed)
 " [facility]    CSN: [id number] Arrival date & time: 10/05/24  1049      History   Chief Complaint Chief Complaint  Patient presents with   Ear Fullness    Entered by patient    HPI Edward Armstrong. is a 74 y.o. male.   Patient presents with sensation of right ear fullness.  Patient reports that he occasionally has issues with cerumen impaction and believes that this is the same today.  Patient denies any ear pain or drainage.  Patient reports that he has had some mild sinus congestion and sinus pressure.  The history is provided by the patient and medical records.  Ear Fullness    Past Medical History:  Diagnosis Date   Allergy    SEASONAL   Anxiety    Cataract    BEGINNING,BILATERAL   Diabetes mellitus without complication (HCC)    GERD (gastroesophageal reflux disease)    Hyperlipidemia    Hypertension    SVT (supraventricular tachycardia)     Patient Active Problem List   Diagnosis Date Noted   SVT (supraventricular tachycardia) 06/10/2020   Palpitations 06/10/2020   Chest pressure 06/10/2020   Essential hypertension 06/10/2020   Hyperlipidemia 06/10/2020   Diabetes mellitus type 2, noninsulin dependent (HCC) 06/10/2020   Left shoulder pain 02/21/2013   Rotator cuff syndrome of left shoulder 02/21/2013    Past Surgical History:  Procedure Laterality Date   APPENDECTOMY     CARPAL TUNNEL RELEASE Left 04/01/2021   Procedure: LEFT CARPAL TUNNEL RELEASE;  Surgeon: Murrell Kuba, MD;  Location: [facility];  Service: Orthopedics;  Laterality: Left;  MAC and beir block   COLONOSCOPY     EYE SURGERY Right    LASER FOR RETINA TEAR   POLYPECTOMY     SVT ABLATION N/A 08/02/2020   Procedure: SVT ABLATION;  Surgeon: Inocencio Soyla Lunger, MD;  Location: [facility];  Service: Cardiovascular;  Laterality: N/A;   TRIGGER FINGER RELEASE Left 04/01/2021   Procedure: RELEASE TRIGGER FINGER/A-1 PULLEY LEFT MIDDLE FINGER;  Surgeon: Murrell Kuba, MD;  Location: [facility];  Service: Orthopedics;  Laterality: Left;  MAC and beir block   VASECTOMY         Home Medications    Prior to Admission medications  Medication Sig Start Date End Date Taking? Authorizing Provider  neomycin -polymyxin-hydrocortisone (CORTISPORIN) 3.5-10000-1 OTIC suspension Place 4 drops into the right ear 3 (three) times daily. 10/05/24  Yes [name], [name], NP  acetaminophen  (TYLENOL ) 500 MG tablet Take 1,000 mg by mouth every 6 (six) hours as needed for mild pain or headache.    [provider]  ALPRAZolam  (XANAX ) 0.25 MG tablet Take 0.25 mg by mouth in the morning and at bedtime.    [provider]  aspirin  EC 81 MG tablet Take 81 mg by mouth daily.    [provider]  cetirizine  (ZYRTEC ) 10 MG tablet Take 1 tablet (10 mg total) by mouth daily. Patient not taking: Reported on 10/18/2023 05/24/23   [name], [name], NP  diclofenac (VOLTAREN) 75 MG EC tablet Take 75 mg by mouth 2 (two) times daily. 10/17/23   [provider]  diltiazem  (CARDIZEM ) 30 MG tablet Take 1 tablet (30 mg total) by mouth every 6 (six) hours as needed (for elevated heart rates). 10/18/23   Camnitz, Soyla Lunger, MD  ezetimibe  (ZETIA ) 10 MG tablet Take 10 mg by mouth at bedtime.  [provider]  fluticasone  (FLONASE ) 50 MCG/ACT nasal spray Place 2 sprays into both nostrils daily. Patient not taking: Reported on 10/18/2023 05/24/23   [name], [name], NP  hydrocortisone (ANUSOL-HC) 2.5 % rectal cream Apply topically. 01/21/24   [provider]  lidocaine  (XYLOCAINE ) 2 % solution Use as directed 10 mLs in the mouth or throat every 3 (three) hours as needed for mouth pain. Patient not taking: Reported on 10/18/2023 10/10/22   [name], PA-C  metFORMIN (GLUCOPHAGE) 1000 MG tablet Take 1,000 mg by mouth 2 (two) times daily with a meal.     [provider]  metoprolol  succinate  (TOPROL -XL) 25 MG 24 hr tablet Take 1 tablet by mouth in the morning and  1 tablet by mouth at bedtime., 11/19/23   Camnitz, Soyla Lunger, MD  Multiple Vitamins-Minerals (VITAMIN C EFFERVESCENT BLEND PO) Take 1 packet by mouth daily.    [provider]  omeprazole (PRILOSEC) 40 MG capsule Take 40 mg by mouth daily.    [provider]  pioglitazone (ACTOS) 15 MG tablet Take 15 mg by mouth. 06/13/21   [provider]  Propylene Glycol (SYSTANE COMPLETE OP) Place 1 drop into both eyes in the morning and at bedtime. Patient not taking: Reported on 11/08/2023    [provider]  RYBELSUS 7 MG TABS Take 1 tablet by mouth daily. 03/25/22   [provider]    Family History Family History  Problem Relation Age of Onset   Diabetes Mother    Heart attack Father    Colon cancer Neg Hx    Esophageal cancer Neg Hx    Liver cancer Neg Hx    Pancreatic cancer Neg Hx    Rectal cancer Neg Hx    Stomach cancer Neg Hx    Colon polyps Neg Hx    Crohn's disease Neg Hx     Social History Social History[1]   Allergies   Macrodantin [nitrofurantoin macrocrystal], Clindamycin/lincomycin, and Septra [sulfamethoxazole-trimethoprim]   Review of Systems Review of Systems  Per HPI  Physical Exam Triage Vital Signs ED Triage Vitals  Encounter Vitals Group     BP 10/05/24 1102 (!) 154/76     Girls Systolic BP Percentile --      Girls Diastolic BP Percentile --      Boys Systolic BP Percentile --      Boys Diastolic BP Percentile --      Pulse Rate 10/05/24 1102 74     Resp 10/05/24 1102 18     Temp 10/05/24 1102 97.7 F (36.5 C)     Temp Source 10/05/24 1102 Oral     SpO2 10/05/24 1102 98 %     Weight --      Height --      Head Circumference --      Peak Flow --      Pain Score 10/05/24 1103 0     Pain Loc --      Pain Education --      Exclude from Growth Chart --    No data found.  Updated Vital Signs BP (!) 154/76 (BP Location: Right Arm)    Pulse 74   Temp 97.7 F (36.5 C) (Oral)   Resp 18   SpO2 98%   Visual Acuity Right Eye Distance:   Left Eye Distance:   Bilateral Distance:    Right Eye Near:   Left Eye Near:    Bilateral Near:     Physical  Exam Vitals and nursing note reviewed.  Constitutional:      General: He is awake. He is not in acute distress.    Appearance: Normal appearance. He is well-developed and well-groomed. He is not ill-appearing.  HENT:     Right Ear: Tympanic membrane and external ear normal. Swelling present. No drainage or tenderness.     Left Ear: Tympanic membrane, ear canal and external ear normal.     Ears:     Comments: Mild swelling noted to right ear canal without any presence of cerumen.  TM is normal in appearance and intact    Nose: Congestion present. No rhinorrhea.     Mouth/Throat:     Mouth: Mucous membranes are moist.     Pharynx: Oropharynx is clear.  Neurological:     Mental Status: He is alert.  Psychiatric:        Behavior: Behavior is cooperative.      UC Treatments / Results  Labs (all labs ordered are listed, but only abnormal results are displayed) Labs Reviewed - No data to display  EKG   Radiology No results found.  Procedures Procedures (including critical care time)  Medications Ordered in UC Medications - No data to display  Initial Impression / Assessment and Plan / UC Course  I have reviewed the triage vital signs and the nursing notes.  Pertinent labs & imaging results that were available during my care of the patient were reviewed by me and considered in my medical decision making (see chart for details).     Patient is overall well-appearing.  Vitals are stable.  Prescribed Cortisporin to cover for otitis externa.  Recommend using over-the-counter saline nasal spray to help with nasal congestion which may help with your fullness as well.  Discussed follow-up and return precautions. Final Clinical Impressions(s) / UC Diagnoses    Final diagnoses:  Infective otitis externa of right ear  Sensation of fullness in right ear     Discharge Instructions      Apply Cortisporin eardrops 4 drops 3 times daily for 5 days for ear canal infection coverage. You can use any over-the-counter saline nasal spray to help with nasal congestion and attempt to help with ear fullness as well. Follow-up with primary care provider or return here as needed.   ED Prescriptions     Medication Sig Dispense Auth. Provider   neomycin -polymyxin-hydrocortisone (CORTISPORIN) 3.5-10000-1 OTIC suspension Place 4 drops into the right ear 3 (three) times daily. 10 mL [name] Flaming A, NP      PDMP not reviewed this encounter.    [1]  Social History Tobacco Use   Smoking status: Never    Passive exposure: Past (long time ago)   Smokeless tobacco: Never  Vaping Use   Vaping status: Never Used  Substance Use Topics   Alcohol use: Yes    Comment: social   Drug use: No     [name] Flaming LABOR, NP 10/05/24 1111  "
